# Patient Record
Sex: Female | Born: 1940 | Race: White | Hispanic: No | Marital: Married | Smoking: Never smoker
Health system: Southern US, Community
[De-identification: ages and names within clinical notes are randomized; demographics above are authoritative.]

## PROBLEM LIST (undated history)

## (undated) DIAGNOSIS — R51 Headache: Secondary | ICD-10-CM

## (undated) DIAGNOSIS — Z8589 Personal history of malignant neoplasm of other organs and systems: Secondary | ICD-10-CM

## (undated) DIAGNOSIS — M1990 Unspecified osteoarthritis, unspecified site: Secondary | ICD-10-CM

## (undated) DIAGNOSIS — K219 Gastro-esophageal reflux disease without esophagitis: Secondary | ICD-10-CM

## (undated) DIAGNOSIS — L570 Actinic keratosis: Secondary | ICD-10-CM

## (undated) DIAGNOSIS — C4492 Squamous cell carcinoma of skin, unspecified: Secondary | ICD-10-CM

## (undated) DIAGNOSIS — R519 Headache, unspecified: Secondary | ICD-10-CM

## (undated) HISTORY — PX: JOINT REPLACEMENT: SHX530

## (undated) HISTORY — PX: APPENDECTOMY: SHX54

## (undated) HISTORY — DX: Actinic keratosis: L57.0

## (undated) HISTORY — PX: TONSILLECTOMY: SUR1361

## (undated) HISTORY — PX: ABDOMINAL HYSTERECTOMY: SHX81

## (undated) HISTORY — PX: TRIGGER FINGER RELEASE: SHX641

## (undated) HISTORY — DX: Personal history of malignant neoplasm of other organs and systems: Z85.89

## (undated) HISTORY — PX: CHOLECYSTECTOMY: SHX55

## (undated) HISTORY — PX: COLONOSCOPY: SHX174

## (undated) HISTORY — PX: AUGMENTATION MAMMAPLASTY: SUR837

## (undated) HISTORY — PX: CATARACT EXTRACTION, BILATERAL: SHX1313

## (undated) HISTORY — PX: EYE SURGERY: SHX253

---

## 2004-01-11 ENCOUNTER — Ambulatory Visit: Payer: Self-pay | Admitting: Gastroenterology

## 2004-01-21 ENCOUNTER — Emergency Department: Payer: Self-pay | Admitting: Emergency Medicine

## 2004-04-09 ENCOUNTER — Ambulatory Visit: Payer: Self-pay | Admitting: Internal Medicine

## 2004-11-22 ENCOUNTER — Ambulatory Visit: Payer: Self-pay

## 2005-04-10 ENCOUNTER — Ambulatory Visit: Payer: Self-pay | Admitting: Internal Medicine

## 2006-04-13 ENCOUNTER — Ambulatory Visit: Payer: Self-pay | Admitting: Internal Medicine

## 2007-04-15 ENCOUNTER — Ambulatory Visit: Payer: Self-pay | Admitting: Internal Medicine

## 2008-04-17 ENCOUNTER — Ambulatory Visit: Payer: Self-pay | Admitting: Internal Medicine

## 2008-09-11 ENCOUNTER — Ambulatory Visit: Payer: Self-pay

## 2009-02-06 ENCOUNTER — Ambulatory Visit: Payer: Self-pay | Admitting: Gastroenterology

## 2009-04-18 ENCOUNTER — Ambulatory Visit: Payer: Self-pay | Admitting: Internal Medicine

## 2010-04-22 ENCOUNTER — Ambulatory Visit: Payer: Self-pay | Admitting: Internal Medicine

## 2011-03-21 ENCOUNTER — Ambulatory Visit: Payer: Self-pay | Admitting: Internal Medicine

## 2011-04-23 ENCOUNTER — Ambulatory Visit: Payer: Self-pay | Admitting: Internal Medicine

## 2012-02-26 ENCOUNTER — Encounter: Payer: Self-pay | Admitting: Rheumatology

## 2012-02-28 ENCOUNTER — Encounter: Payer: Self-pay | Admitting: Rheumatology

## 2012-04-23 ENCOUNTER — Ambulatory Visit: Payer: Self-pay | Admitting: Internal Medicine

## 2012-11-22 DIAGNOSIS — C4492 Squamous cell carcinoma of skin, unspecified: Secondary | ICD-10-CM

## 2012-11-22 HISTORY — DX: Squamous cell carcinoma of skin, unspecified: C44.92

## 2013-04-25 ENCOUNTER — Ambulatory Visit: Payer: Self-pay | Admitting: Family Medicine

## 2013-05-03 ENCOUNTER — Ambulatory Visit: Payer: Self-pay | Admitting: Family Medicine

## 2013-07-12 DIAGNOSIS — G43909 Migraine, unspecified, not intractable, without status migrainosus: Secondary | ICD-10-CM | POA: Insufficient documentation

## 2013-07-12 DIAGNOSIS — G479 Sleep disorder, unspecified: Secondary | ICD-10-CM | POA: Insufficient documentation

## 2014-02-23 ENCOUNTER — Ambulatory Visit: Payer: Self-pay | Admitting: Gastroenterology

## 2014-04-28 ENCOUNTER — Ambulatory Visit
Admit: 2014-04-28 | Disposition: A | Discharge status: Home / Self Care | Payer: Self-pay | Attending: Family Medicine | Admitting: Family Medicine

## 2015-02-02 ENCOUNTER — Other Ambulatory Visit: Payer: Self-pay | Admitting: Orthopedic Surgery

## 2015-02-02 DIAGNOSIS — M25562 Pain in left knee: Secondary | ICD-10-CM

## 2015-02-21 ENCOUNTER — Ambulatory Visit
Admission: RE | Admit: 2015-02-21 | Discharge: 2015-02-21 | Disposition: A | Discharge status: Home / Self Care | Payer: Medicare Other | Source: Ambulatory Visit | Attending: Orthopedic Surgery | Admitting: Orthopedic Surgery

## 2015-02-21 DIAGNOSIS — M659 Synovitis and tenosynovitis, unspecified: Secondary | ICD-10-CM | POA: Diagnosis not present

## 2015-02-21 DIAGNOSIS — M25461 Effusion, right knee: Secondary | ICD-10-CM | POA: Diagnosis not present

## 2015-02-21 DIAGNOSIS — M1712 Unilateral primary osteoarthritis, left knee: Secondary | ICD-10-CM | POA: Diagnosis not present

## 2015-02-21 DIAGNOSIS — M25562 Pain in left knee: Secondary | ICD-10-CM

## 2015-02-21 DIAGNOSIS — X58XXXA Exposure to other specified factors, initial encounter: Secondary | ICD-10-CM | POA: Insufficient documentation

## 2015-02-21 DIAGNOSIS — S83232A Complex tear of medial meniscus, current injury, left knee, initial encounter: Secondary | ICD-10-CM | POA: Diagnosis not present

## 2015-03-23 ENCOUNTER — Encounter
Admission: RE | Admit: 2015-03-23 | Discharge: 2015-03-23 | Disposition: A | Discharge status: Home / Self Care | Payer: Medicare Other | Source: Ambulatory Visit | Attending: Orthopedic Surgery | Admitting: Orthopedic Surgery

## 2015-03-23 DIAGNOSIS — Z01812 Encounter for preprocedural laboratory examination: Secondary | ICD-10-CM | POA: Insufficient documentation

## 2015-03-23 DIAGNOSIS — Z01810 Encounter for preprocedural cardiovascular examination: Secondary | ICD-10-CM | POA: Diagnosis present

## 2015-03-23 HISTORY — DX: Gastro-esophageal reflux disease without esophagitis: K21.9

## 2015-03-23 HISTORY — DX: Unspecified osteoarthritis, unspecified site: M19.90

## 2015-03-23 HISTORY — DX: Headache, unspecified: R51.9

## 2015-03-23 HISTORY — DX: Headache: R51

## 2015-03-23 LAB — CBC
HCT: 39 % (ref 35.0–47.0)
Hemoglobin: 13.3 g/dL (ref 12.0–16.0)
MCH: 31.8 pg (ref 26.0–34.0)
MCHC: 34.2 g/dL (ref 32.0–36.0)
MCV: 92.9 fL (ref 80.0–100.0)
PLATELETS: 239 10*3/uL (ref 150–440)
RBC: 4.2 MIL/uL (ref 3.80–5.20)
RDW: 12.8 % (ref 11.5–14.5)
WBC: 6.9 10*3/uL (ref 3.6–11.0)

## 2015-03-23 NOTE — Patient Instructions (Signed)
  Your procedure is scheduled on: Monday 04/02/15 Report to Day Surgery. 2ND FLOOR MEDICAL MALL ENTRANCE To find out your arrival time please call [phone] between 1PM - 3PM on Friday 03/30/15.  Remember: Instructions that are not followed completely may result in serious medical risk, up to and including death, or upon the discretion of your surgeon and anesthesiologist your surgery may need to be rescheduled.    __X__ 1. Do not eat food or drink liquids after midnight. No gum chewing or hard candies.     __X__ 2. No Alcohol for 24 hours before or after surgery.   ____ 3. Bring all medications with you on the day of surgery if instructed.    __X__ 4. Notify your doctor if there is any change in your medical condition     (cold, fever, infections).     Do not wear jewelry, make-up, hairpins, clips or nail polish.  Do not wear lotions, powders, or perfumes.   Do not shave 48 hours prior to surgery. Men may shave face and neck.  Do not bring valuables to the hospital.    [facility] is not responsible for any belongings or valuables.               Contacts, dentures or bridgework may not be worn into surgery.  Leave your suitcase in the car. After surgery it may be brought to your room.  For patients admitted to the hospital, discharge time is determined by your                treatment team.   Patients discharged the day of surgery will not be allowed to drive home.   Please read over the following fact sheets that you were given:   Surgical Site Infection Prevention   __X__ Take these medicines the morning of surgery with A SIP OF WATER:    1. OMEPRAZOLE AT BEDTIME NIGHT BEFORE SURGERY AND MORNING OF  2.   3.   4.  5.  6.  ____ Fleet Enema (as directed)   __X__ Use CHG Soap as directed  ____ Use inhalers on the day of surgery  ____ Stop metformin 2 days prior to surgery    ____ Take 1/2 of usual insulin dose the night before surgery and none on the morning of surgery.    ____ Stop Coumadin/Plavix/aspirin on   __X__ Stop Anti-inflammatories on TODAY, NAPROXEN   __X__ Stop supplements until after surgery.  (B50, GLUCOSAMINE, GRAPE SEED, FISH OIL)  ____ Bring C-Pap to the hospital.

## 2015-03-23 NOTE — Pre-Procedure Instructions (Signed)
Discussed need for crutch instruction with patient she stated "I have crutches from my previous knee replacement and i remember how to use them and  do not need any new instruction."

## 2015-03-24 NOTE — Pre-Procedure Instructions (Signed)
EKG sent to anesthesia for review

## 2015-03-26 NOTE — Pre-Procedure Instructions (Signed)
CALLED FOR EKG FROM 2013 [facility]

## 2015-03-30 ENCOUNTER — Other Ambulatory Visit: Payer: Self-pay | Admitting: Family Medicine

## 2015-03-30 DIAGNOSIS — Z1231 Encounter for screening mammogram for malignant neoplasm of breast: Secondary | ICD-10-CM

## 2015-04-02 ENCOUNTER — Ambulatory Visit: Payer: Medicare Other | Admitting: Anesthesiology

## 2015-04-02 ENCOUNTER — Ambulatory Visit
Admission: RE | Admit: 2015-04-02 | Discharge: 2015-04-02 | Disposition: A | Discharge status: Home / Self Care | Payer: Medicare Other | Source: Ambulatory Visit | Attending: Orthopedic Surgery | Admitting: Orthopedic Surgery

## 2015-04-02 ENCOUNTER — Encounter
Admission: RE | Disposition: A | Discharge status: Home / Self Care | Payer: Self-pay | Source: Ambulatory Visit | Attending: Orthopedic Surgery

## 2015-04-02 ENCOUNTER — Encounter: Payer: Self-pay | Admitting: *Deleted

## 2015-04-02 DIAGNOSIS — Z808 Family history of malignant neoplasm of other organs or systems: Secondary | ICD-10-CM | POA: Insufficient documentation

## 2015-04-02 DIAGNOSIS — M25462 Effusion, left knee: Secondary | ICD-10-CM | POA: Diagnosis not present

## 2015-04-02 DIAGNOSIS — Z8249 Family history of ischemic heart disease and other diseases of the circulatory system: Secondary | ICD-10-CM | POA: Insufficient documentation

## 2015-04-02 DIAGNOSIS — Z885 Allergy status to narcotic agent status: Secondary | ICD-10-CM | POA: Insufficient documentation

## 2015-04-02 DIAGNOSIS — M23312 Other meniscus derangements, anterior horn of medial meniscus, left knee: Secondary | ICD-10-CM | POA: Diagnosis not present

## 2015-04-02 DIAGNOSIS — M2242 Chondromalacia patellae, left knee: Secondary | ICD-10-CM | POA: Insufficient documentation

## 2015-04-02 DIAGNOSIS — Z90710 Acquired absence of both cervix and uterus: Secondary | ICD-10-CM | POA: Diagnosis not present

## 2015-04-02 DIAGNOSIS — Z888 Allergy status to other drugs, medicaments and biological substances status: Secondary | ICD-10-CM | POA: Diagnosis not present

## 2015-04-02 DIAGNOSIS — Z9841 Cataract extraction status, right eye: Secondary | ICD-10-CM | POA: Diagnosis not present

## 2015-04-02 DIAGNOSIS — Z882 Allergy status to sulfonamides status: Secondary | ICD-10-CM | POA: Insufficient documentation

## 2015-04-02 DIAGNOSIS — Z8371 Family history of colonic polyps: Secondary | ICD-10-CM | POA: Diagnosis not present

## 2015-04-02 DIAGNOSIS — Z8261 Family history of arthritis: Secondary | ICD-10-CM | POA: Diagnosis not present

## 2015-04-02 DIAGNOSIS — M1990 Unspecified osteoarthritis, unspecified site: Secondary | ICD-10-CM | POA: Insufficient documentation

## 2015-04-02 DIAGNOSIS — L309 Dermatitis, unspecified: Secondary | ICD-10-CM | POA: Insufficient documentation

## 2015-04-02 DIAGNOSIS — Z9889 Other specified postprocedural states: Secondary | ICD-10-CM | POA: Insufficient documentation

## 2015-04-02 DIAGNOSIS — M23322 Other meniscus derangements, posterior horn of medial meniscus, left knee: Secondary | ICD-10-CM | POA: Insufficient documentation

## 2015-04-02 DIAGNOSIS — Z807 Family history of other malignant neoplasms of lymphoid, hematopoietic and related tissues: Secondary | ICD-10-CM | POA: Diagnosis not present

## 2015-04-02 DIAGNOSIS — Z96651 Presence of right artificial knee joint: Secondary | ICD-10-CM | POA: Insufficient documentation

## 2015-04-02 DIAGNOSIS — Z9842 Cataract extraction status, left eye: Secondary | ICD-10-CM | POA: Diagnosis not present

## 2015-04-02 DIAGNOSIS — Z833 Family history of diabetes mellitus: Secondary | ICD-10-CM | POA: Diagnosis not present

## 2015-04-02 DIAGNOSIS — Z79899 Other long term (current) drug therapy: Secondary | ICD-10-CM | POA: Insufficient documentation

## 2015-04-02 DIAGNOSIS — M2392 Unspecified internal derangement of left knee: Secondary | ICD-10-CM | POA: Diagnosis present

## 2015-04-02 HISTORY — PX: KNEE ARTHROSCOPY: SHX127

## 2015-04-02 SURGERY — ARTHROSCOPY, KNEE
Anesthesia: General | Site: Knee | Laterality: Left | Wound class: Clean

## 2015-04-02 MED ORDER — HYDROCODONE-ACETAMINOPHEN 5-325 MG PO TABS: 1.0000 | ORAL_TABLET | ORAL | Status: DC | PRN

## 2015-04-02 MED ORDER — MIDAZOLAM HCL 2 MG/2ML IJ SOLN
INTRAMUSCULAR | Status: DC | PRN
  Administered 2015-04-02: 2 mg via INTRAVENOUS

## 2015-04-02 MED ORDER — ONDANSETRON HCL 4 MG/2ML IJ SOLN
INTRAMUSCULAR | Status: DC | PRN
  Administered 2015-04-02: 4 mg via INTRAVENOUS

## 2015-04-02 MED ORDER — ACETAMINOPHEN 10 MG/ML IV SOLN
INTRAVENOUS | Status: DC | PRN
  Administered 2015-04-02: 1000 mg via INTRAVENOUS

## 2015-04-02 MED ORDER — PHENYLEPHRINE HCL 10 MG/ML IJ SOLN
INTRAMUSCULAR | Status: DC | PRN
  Administered 2015-04-02 (×2): 100 ug via INTRAVENOUS

## 2015-04-02 MED ORDER — GLYCOPYRROLATE 0.2 MG/ML IJ SOLN
INTRAMUSCULAR | Status: DC | PRN
  Administered 2015-04-02: 0.2 mg via INTRAVENOUS

## 2015-04-02 MED ORDER — MORPHINE SULFATE (PF) 4 MG/ML IV SOLN
INTRAVENOUS | Status: AC
  Filled 2015-04-02: qty 1

## 2015-04-02 MED ORDER — BUPIVACAINE-EPINEPHRINE (PF) 0.25% -1:200000 IJ SOLN
INTRAMUSCULAR | Status: AC
  Filled 2015-04-02: qty 30

## 2015-04-02 MED ORDER — CEFAZOLIN SODIUM-DEXTROSE 2-3 GM-% IV SOLR
INTRAVENOUS | Status: AC
  Filled 2015-04-02: qty 50

## 2015-04-02 MED ORDER — FENTANYL CITRATE (PF) 100 MCG/2ML IJ SOLN
25.0000 ug | INTRAMUSCULAR | Status: DC | PRN
  Administered 2015-04-02 (×4): 25 ug via INTRAVENOUS

## 2015-04-02 MED ORDER — CEFAZOLIN SODIUM-DEXTROSE 2-3 GM-% IV SOLR
2.0000 g | Freq: Once | INTRAVENOUS | Status: AC
  Administered 2015-04-02: 2 g via INTRAVENOUS

## 2015-04-02 MED ORDER — FENTANYL CITRATE (PF) 100 MCG/2ML IJ SOLN
INTRAMUSCULAR | Status: DC | PRN
  Administered 2015-04-02: 100 ug via INTRAVENOUS
  Administered 2015-04-02: 50 ug via INTRAVENOUS

## 2015-04-02 MED ORDER — MORPHINE SULFATE (PF) 4 MG/ML IV SOLN
INTRAVENOUS | Status: DC | PRN
  Administered 2015-04-02: 4 mg

## 2015-04-02 MED ORDER — ACETAMINOPHEN 10 MG/ML IV SOLN
INTRAVENOUS | Status: AC
  Filled 2015-04-02: qty 100

## 2015-04-02 MED ORDER — LIDOCAINE HCL (CARDIAC) 20 MG/ML IV SOLN
INTRAVENOUS | Status: DC | PRN
  Administered 2015-04-02: 100 mg via INTRAVENOUS

## 2015-04-02 MED ORDER — BUPIVACAINE HCL 0.25 % IJ SOLN
INTRAMUSCULAR | Status: DC | PRN
  Administered 2015-04-02: 25 mL
  Administered 2015-04-02: 5 mL

## 2015-04-02 MED ORDER — ONDANSETRON HCL 4 MG/2ML IJ SOLN: 4.0000 mg | Freq: Once | INTRAMUSCULAR | Status: DC | PRN

## 2015-04-02 MED ORDER — FENTANYL CITRATE (PF) 100 MCG/2ML IJ SOLN
INTRAMUSCULAR | Status: AC
  Administered 2015-04-02: 25 ug via INTRAVENOUS
  Filled 2015-04-02: qty 2

## 2015-04-02 MED ORDER — FAMOTIDINE 20 MG PO TABS
ORAL_TABLET | ORAL | Status: AC
  Filled 2015-04-02: qty 1

## 2015-04-02 MED ORDER — EPHEDRINE SULFATE 50 MG/ML IJ SOLN
INTRAMUSCULAR | Status: DC | PRN
  Administered 2015-04-02: 5 mg via INTRAVENOUS
  Administered 2015-04-02: 10 mg via INTRAVENOUS

## 2015-04-02 MED ORDER — PROPOFOL 10 MG/ML IV BOLUS
INTRAVENOUS | Status: DC | PRN
  Administered 2015-04-02: 140 mg via INTRAVENOUS

## 2015-04-02 MED ORDER — DEXAMETHASONE SODIUM PHOSPHATE 10 MG/ML IJ SOLN
INTRAMUSCULAR | Status: DC | PRN
  Administered 2015-04-02: 10 mg via INTRAVENOUS

## 2015-04-02 MED ORDER — LACTATED RINGERS IV SOLN
INTRAVENOUS | Status: DC
  Administered 2015-04-02 (×2): via INTRAVENOUS

## 2015-04-02 SURGICAL SUPPLY — 21 items
BLADE SHAVER 4.5 DBL SERAT CV (CUTTER) ×3 IMPLANT
BNDG ESMARK 6X12 TAN STRL LF (GAUZE/BANDAGES/DRESSINGS) ×3 IMPLANT
DRSG DERMACEA 8X12 NADH (GAUZE/BANDAGES/DRESSINGS) ×3 IMPLANT
DURAPREP 26ML APPLICATOR (WOUND CARE) ×6 IMPLANT
GAUZE SPONGE 4X4 12PLY STRL (GAUZE/BANDAGES/DRESSINGS) ×3 IMPLANT
GLOVE BIOGEL M STRL SZ7.5 (GLOVE) ×3 IMPLANT
GLOVE INDICATOR 8.0 STRL GRN (GLOVE) ×3 IMPLANT
GOWN STRL REUS W/ TWL LRG LVL3 (GOWN DISPOSABLE) ×2 IMPLANT
GOWN STRL REUS W/TWL LRG LVL3 (GOWN DISPOSABLE) ×4
IV LACTATED RINGER IRRG 3000ML (IV SOLUTION) ×12
IV LR IRRIG 3000ML ARTHROMATIC (IV SOLUTION) ×6 IMPLANT
MANIFOLD NEPTUNE II (INSTRUMENTS) ×3 IMPLANT
PACK ARTHROSCOPY KNEE (MISCELLANEOUS) ×3 IMPLANT
SET TUBE SUCT SHAVER OUTFL 24K (TUBING) ×3 IMPLANT
SET TUBE TIP INTRA-ARTICULAR (MISCELLANEOUS) ×3 IMPLANT
STRAP SAFETY BODY (MISCELLANEOUS) ×3 IMPLANT
SUT ETHILON 3-0 FS-10 30 BLK (SUTURE) ×3
SUTURE EHLN 3-0 FS-10 30 BLK (SUTURE) ×1 IMPLANT
TUBING ARTHRO INFLOW-ONLY STRL (TUBING) ×3 IMPLANT
WAND HAND CNTRL MULTIVAC 50 (MISCELLANEOUS) ×3 IMPLANT
WRAP KNEE W/COLD PACKS 25.5X14 (SOFTGOODS) ×3 IMPLANT

## 2015-04-02 NOTE — Anesthesia Procedure Notes (Signed)
Procedure Name: LMA Insertion Date/Time: 04/02/2015 4:03 PM Performed by: Silvana Newness Pre-anesthesia Checklist: Patient identified, Emergency Drugs available, Suction available, Patient being monitored and Timeout performed Patient Re-evaluated:Patient Re-evaluated prior to inductionOxygen Delivery Method: Circle system utilized Preoxygenation: Pre-oxygenation with 100% oxygen Intubation Type: IV induction Ventilation: Mask ventilation without difficulty LMA: LMA inserted LMA Size: 3.5 Number of attempts: 1 Placement Confirmation: positive ETCO2 and breath sounds checked- equal and bilateral Tube secured with: Tape Dental Injury: Teeth and Oropharynx as per pre-operative assessment  Comments: Inserted by Nelda Marseille CRNA

## 2015-04-02 NOTE — Op Note (Signed)
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   ASSISTANT: none  ANESTHESIA: general  ESTIMATED BLOOD LOSS: Minimal  FLUIDS REPLACED: 700 mL of crystalloid  TOURNIQUET TIME:  not used   DRAINS: none  IMPLANTS UTILIZED:  None  INDICATIONS FOR SURGERY: [name] is a 75 y.o. year old female who has been seen for complaints of left knee pain. MRI demonstrated findings consistent with meniscal pathology. After discussion of the risks and benefits of surgical intervention, the patient expressed understanding of the risks benefits and agree with plans for left knee arthroscopy.   PROCEDURE IN DETAIL: The patient was brought into the operating room and, after adequate general anesthesia was achieved, a tourniquet was applied to the left thigh and the leg was placed in the leg holder. All bony prominences were well padded. The patient's left knee was cleaned and prepped with alcohol and Duraprep and draped in the usual sterile fashion. A "timeout" was performed as per usual protocol. The anticipated portal sites were injected with 0.25% Marcaine with epinephrine. An anterolateral incision was made and a cannula was inserted. A small effusion was evacuated and the knee was distended with fluid using the pump. The scope was advanced down the medial gutter into the medial compartment. Under visualization with the scope, an anteromedial portal was created and a hooked probe was inserted. The medial meniscus was visualized and probed. Degenerative tears were noted to both the  anterior and posterior horn of the medial meniscus. These areas were debrided using meniscal punches and a 4.5 mm shaver. Final contouring was performed using a 50 ArthroCare wand. The articular cartilage was visualized. Extensive areas of grade 4 chondromalacia were noted to both the medial femoral condyle and medial tibial plateau. These areas were debrided and contoured using the 50 ArthroCare wand. The scope was then advanced into the intercondylar notch. The anterior cruciate ligament was visualized and probed and felt to be intact. The scope was removed from the lateral portal and reinserted via the anteromedial portal to better visualize the lateral compartment. The lateral meniscus was visualized and probed. The lateral meniscus was intact without evidence of tear or instability.  The articular cartilage of the lateral compartment was visualized and noted to be in good condition.  Finally, the scope was advanced so as to visualize the patellofemoral articulation. Good patellar tracking was appreciated. Mild chondral changes were noted.  The knee was irrigated with copius amounts of fluid and suctioned dry. The anterolateral portal was re-approximated with #3-0 nylon. A combination of 0.25% Marcaine with epinephrine and 4 mg of Morphine were injected via the scope. The scope was removed and the anteromedial portal was re-approximated with #3-0 nylon. A sterile dressing was applied followed by application of an ice wrap.  The patient tolerated the procedure well and was transported to the PACU in stable condition.  James P. Holley Bouche., M.D.

## 2015-04-02 NOTE — H&P (Signed)
The patient has been re-examined, and the chart reviewed, and there have been no interval changes to the documented history and physical.    The risks, benefits, and alternatives have been discussed at length. The patient expressed understanding of the risks benefits and agreed with plans for surgical intervention.  James P. Hooten, Jr. M.D.    

## 2015-04-02 NOTE — Discharge Instructions (Signed)
°  Instructions after Knee Arthroscopy  ° °- James P. Hooten, Jr., M.D.    ° Dept. of Orthopaedics & Sports Medicine ° [facility] ° [address] ° [place], [place]  [address] ° ° Phone: [phone]   Fax: [phone] ° ° °DIET: °• Drink plenty of non-alcoholic fluids & begin a light diet. °• Resume your normal diet the day after surgery. ° °ACTIVITY:  °• You may use crutches or a walker with weight-bearing as tolerated, unless instructed otherwise. °• You may wean yourself off of the walker or crutches as tolerated.  °• Begin doing gentle exercises. Exercising will reduce the pain and swelling, increase motion, and prevent muscle weakness.   °• Avoid strenuous activities or athletics for a minimum of 4-6 weeks after arthroscopic surgery. °• Do not drive or operate any equipment until instructed. ° °WOUND CARE:  °• Place one to two pillows under the knee the first day or two when sitting or lying.  °• Continue to use the ice packs periodically to reduce pain and swelling. °• The small incisions in your knee are closed with nylon stitches. The stitches will be removed in the office. °• The bulky dressing may be removed on the second day after surgery. DO NOT TOUCH THE STITCHES. Put a Band-Aid over each stitch. Do NOT use any ointments or creams on the incisions.  °• You may bathe or shower after the stitches are removed at the first office visit following surgery. ° °MEDICATIONS: °• You may resume your regular medications. °• Please take the pain medication as prescribed. °• Do not take pain medication on an empty stomach. °• Do not drive or drink alcoholic beverages when taking pain medications. ° °CALL THE OFFICE FOR: °• Temperature above 101 degrees °• Excessive bleeding or drainage on the dressing. °• Excessive swelling, coldness, or paleness of the toes. °• Persistent nausea and vomiting. ° °FOLLOW-UP:  °• You should have an appointment to return to the office in 7-10 days after surgery.   ° ° °AMBULATORY SURGERY  °DISCHARGE INSTRUCTIONS ° ° °1) The drugs that you were given will stay in your system until tomorrow so for the next 24 hours you should not: ° °A) Drive an automobile °B) Make any legal decisions °C) Drink any alcoholic beverage ° ° °2) You may resume regular meals tomorrow.  Today it is better to start with liquids and gradually work up to solid foods. ° °You may eat anything you prefer, but it is better to start with liquids, then soup and crackers, and gradually work up to solid foods. ° ° °3) Please notify your doctor immediately if you have any unusual bleeding, trouble breathing, redness and pain at the surgery site, drainage, fever, or pain not relieved by medication. ° ° ° °4) Additional Instructions: ° ° ° ° ° ° ° °Please contact your physician with any problems or Same Day Surgery at [phone], Monday through Friday 6 am to 4 pm, or [place] [facility] [place] Main number at [phone]. ° °

## 2015-04-02 NOTE — Transfer of Care (Signed)
Immediate Anesthesia Transfer of Care Note  Patient: Donna Jefferson  Procedure(s) Performed: Procedure(s): ARTHROSCOPY KNEE. PARTIAL MEDIAL MENISECTOMY, MEDIAL CHONDROPLASTY (Left)  Patient Location: PACU  Anesthesia Type:General  Level of Consciousness: awake, alert , oriented and patient cooperative  Airway & Oxygen Therapy: Patient Spontanous Breathing and Patient connected to face mask oxygen  Post-op Assessment: Report given to RN, Post -op Vital signs reviewed and stable and Patient moving all extremities X 4  Post vital signs: Reviewed and stable  Last Vitals: ECG: 95, [id number], sats 0000000  Complications: No apparent anesthesia complications

## 2015-04-02 NOTE — Anesthesia Preprocedure Evaluation (Signed)
Anesthesia Evaluation  Patient identified by MRN, date of birth, ID band Patient awake    Reviewed: Allergy & Precautions, H&P , NPO status , Patient's Chart, lab work & pertinent test results, reviewed documented beta blocker date and time   History of Anesthesia Complications Negative for: history of anesthetic complications  Airway Mallampati: III  TM Distance: >3 FB Neck ROM: full    Dental no notable dental hx. (+) Caps, Teeth Intact   Pulmonary neg pulmonary ROS, former smoker,    Pulmonary exam normal breath sounds clear to auscultation       Cardiovascular Exercise Tolerance: Good negative cardio ROS Normal cardiovascular exam Rhythm:regular Rate:Normal     Neuro/Psych  Headaches, neg Seizures negative psych ROS   GI/Hepatic Neg liver ROS, GERD  Medicated,  Endo/Other  negative endocrine ROS  Renal/GU negative Renal ROS  negative genitourinary   Musculoskeletal   Abdominal   Peds  Hematology negative hematology ROS (+)   Anesthesia Other Findings Past Medical History:   Headache                                                     Arthritis                                                      Comment:osteoarthritis   GERD (gastroesophageal reflux disease)                       Cancer (HCC)                                                   Comment:squamous cell skin cancer   Reproductive/Obstetrics negative OB ROS                             Anesthesia Physical Anesthesia Plan  ASA: II  Anesthesia Plan: General   Post-op Pain Management:    Induction:   Airway Management Planned:   Additional Equipment:   Intra-op Plan:   Post-operative Plan:   Informed Consent: I have reviewed the patients History and Physical, chart, labs and discussed the procedure including the risks, benefits and alternatives for the proposed anesthesia with the patient or authorized  representative who has indicated his/her understanding and acceptance.   Dental Advisory Given  Plan Discussed with: Anesthesiologist, CRNA and Surgeon  Anesthesia Plan Comments:         Anesthesia Quick Evaluation

## 2015-04-02 NOTE — Brief Op Note (Signed)
04/02/2015  5:27 PM  PATIENT:  Randall Hiss  75 y.o. female  PRE-OPERATIVE DIAGNOSIS:  internal derangement of the left knee  POST-OPERATIVE DIAGNOSIS:  MEDIAL MENISCUS TEAR, GRADE IV CHONDROMALACIA MEDIAL COMPARTMENT - Left knee  PROCEDURE:  Procedure(s): ARTHROSCOPY KNEE. PARTIAL MEDIAL MENISECTOMY, MEDIAL CHONDROPLASTY (Left)  SURGEON:  Surgeon(s) and Role:    * Dereck Leep, MD - Primary  ASSISTANTS: none   ANESTHESIA:   general  EBL:  Total I/O In: 700 [I.V.:700] Out: - minimal  BLOOD ADMINISTERED:none  DRAINS: none   LOCAL MEDICATIONS USED:  MARCAINE     SPECIMEN:  No Specimen  DISPOSITION OF SPECIMEN:  N/A  COUNTS:  YES  TOURNIQUET:   not used   DICTATION: .Sales executive  PLAN OF CARE: Discharge to home after PACU  PATIENT DISPOSITION:  PACU - hemodynamically stable.   Delay start of Pharmacological VTE agent (>24hrs) due to surgical blood loss or risk of bleeding: not applicable

## 2015-04-03 ENCOUNTER — Encounter: Payer: Self-pay | Admitting: Orthopedic Surgery

## 2015-04-03 NOTE — Anesthesia Postprocedure Evaluation (Signed)
Anesthesia Post Note  Patient: Donna Jefferson  Procedure(s) Performed: Procedure(s) (LRB): ARTHROSCOPY KNEE. PARTIAL MEDIAL MENISECTOMY, MEDIAL CHONDROPLASTY (Left)  Patient location during evaluation: PACU Anesthesia Type: General Level of consciousness: awake and alert and oriented Pain management: pain level controlled Vital Signs Assessment: post-procedure vital signs reviewed and stable Respiratory status: spontaneous breathing Cardiovascular status: blood pressure returned to baseline Anesthetic complications: no    Last Vitals:  Filed Vitals:   04/02/15 1828 04/02/15 1858  BP: 134/54 147/58  Pulse:  76  Temp: 36.3 C 36.2 C  Resp: 14 14    Last Pain:  Filed Vitals:   04/03/15 0822  PainSc: 0-No pain                 Indria Bishara

## 2015-04-30 ENCOUNTER — Ambulatory Visit: Payer: Medicare Other

## 2015-05-02 ENCOUNTER — Other Ambulatory Visit: Payer: Self-pay | Admitting: Family Medicine

## 2015-05-02 ENCOUNTER — Ambulatory Visit
Admission: RE | Admit: 2015-05-02 | Discharge: 2015-05-02 | Disposition: A | Discharge status: Home / Self Care | Payer: Medicare Other | Source: Ambulatory Visit | Attending: Family Medicine | Admitting: Family Medicine

## 2015-05-02 DIAGNOSIS — Z9882 Breast implant status: Secondary | ICD-10-CM | POA: Insufficient documentation

## 2015-05-02 DIAGNOSIS — Z1231 Encounter for screening mammogram for malignant neoplasm of breast: Secondary | ICD-10-CM | POA: Diagnosis present

## 2016-02-24 DIAGNOSIS — M1712 Unilateral primary osteoarthritis, left knee: Secondary | ICD-10-CM | POA: Insufficient documentation

## 2016-03-26 ENCOUNTER — Encounter
Admission: RE | Admit: 2016-03-26 | Discharge: 2016-03-26 | Disposition: A | Discharge status: Home / Self Care | Payer: Medicare Other | Source: Ambulatory Visit | Attending: Orthopedic Surgery | Admitting: Orthopedic Surgery

## 2016-03-26 DIAGNOSIS — Z01818 Encounter for other preprocedural examination: Secondary | ICD-10-CM | POA: Diagnosis present

## 2016-03-26 DIAGNOSIS — Z01810 Encounter for preprocedural cardiovascular examination: Secondary | ICD-10-CM | POA: Diagnosis not present

## 2016-03-26 DIAGNOSIS — R011 Cardiac murmur, unspecified: Secondary | ICD-10-CM | POA: Insufficient documentation

## 2016-03-26 DIAGNOSIS — Z01812 Encounter for preprocedural laboratory examination: Secondary | ICD-10-CM | POA: Insufficient documentation

## 2016-03-26 LAB — CBC
HCT: 39.1 % (ref 35.0–47.0)
Hemoglobin: 13.3 g/dL (ref 12.0–16.0)
MCH: 31.2 pg (ref 26.0–34.0)
MCHC: 34.1 g/dL (ref 32.0–36.0)
MCV: 91.6 fL (ref 80.0–100.0)
Platelets: 260 10*3/uL (ref 150–440)
RBC: 4.27 MIL/uL (ref 3.80–5.20)
RDW: 13.4 % (ref 11.5–14.5)
WBC: 7.6 10*3/uL (ref 3.6–11.0)

## 2016-03-26 LAB — COMPREHENSIVE METABOLIC PANEL
ALT: 20 U/L (ref 14–54)
ANION GAP: 8 (ref 5–15)
AST: 29 U/L (ref 15–41)
Albumin: 4.5 g/dL (ref 3.5–5.0)
Alkaline Phosphatase: 83 U/L (ref 38–126)
BUN: 14 mg/dL (ref 6–20)
CHLORIDE: 100 mmol/L — AB (ref 101–111)
CO2: 28 mmol/L (ref 22–32)
CREATININE: 0.72 mg/dL (ref 0.44–1.00)
Calcium: 9.3 mg/dL (ref 8.9–10.3)
GFR calc Af Amer: >60 mL/min (ref >60)
Glucose, Bld: 105 mg/dL — ABNORMAL HIGH (ref 65–99)
POTASSIUM: 3.6 mmol/L (ref 3.5–5.1)
Sodium: 136 mmol/L (ref 135–145)
Total Bilirubin: 0.4 mg/dL (ref 0.3–1.2)
Total Protein: 7.7 g/dL (ref 6.5–8.1)

## 2016-03-26 LAB — SURGICAL PCR SCREEN
MRSA, PCR: NEGATIVE
STAPHYLOCOCCUS AUREUS: NEGATIVE

## 2016-03-26 LAB — URINALYSIS, ROUTINE W REFLEX MICROSCOPIC
Bilirubin Urine: NEGATIVE
GLUCOSE, UA: NEGATIVE mg/dL
HGB URINE DIPSTICK: NEGATIVE
KETONES UR: NEGATIVE mg/dL
LEUKOCYTES UA: NEGATIVE
Nitrite: NEGATIVE
PH: 6 (ref 5.0–8.0)
Protein, ur: NEGATIVE mg/dL
Specific Gravity, Urine: 1.004 — ABNORMAL LOW (ref 1.005–1.030)

## 2016-03-26 LAB — C-REACTIVE PROTEIN

## 2016-03-26 LAB — TYPE AND SCREEN
ABO/RH(D): O POS
Antibody Screen: NEGATIVE

## 2016-03-26 LAB — PROTIME-INR
INR: 0.92
PROTHROMBIN TIME: 12.4 s (ref 11.4–15.2)

## 2016-03-26 LAB — SEDIMENTATION RATE: SED RATE: 27 mm/h (ref 0–30)

## 2016-03-26 LAB — APTT: aPTT: 31 seconds (ref 24–36)

## 2016-03-26 NOTE — Patient Instructions (Signed)
  Your procedure is scheduled on: April 09, 2016 [facility]) Report to Same Day Surgery 2nd floor medical mall ([place] Entrance-take elevator on left to 2nd floor.  Check in with surgery information desk.) To find out your arrival time please call [phone] between 1PM - 3PM on April 08, 2016 (TUESDAY)  Remember: Instructions that are not followed completely may result in serious medical risk, up to and including death, or upon the discretion of your surgeon and anesthesiologist your surgery may need to be rescheduled.    _x___ 1. Do not eat food or drink liquids after midnight. No gum chewing or hard candies.     __x__ 2. No Alcohol for 24 hours before or after surgery.   __x__3. No Smoking for 24 prior to surgery.   ____  4. Bring all medications with you on the day of surgery if instructed.    __x__ 5. Notify your doctor if there is any change in your medical condition     (cold, fever, infections).     Do not wear jewelry, make-up, hairpins, clips or nail polish.  Do not wear lotions, powders, or perfumes. You may wear deodorant.  Do not shave 48 hours prior to surgery. Men may shave face and neck.  Do not bring valuables to the hospital.    [facility] is not responsible for any belongings or valuables.               Contacts, dentures or bridgework may not be worn into surgery.  Leave your suitcase in the car. After surgery it may be brought to your room.  For patients admitted to the hospital, discharge time is determined by your treatment team.   Patients discharged the day of surgery will not be allowed to drive home.  You will need someone to drive you home and stay with you the night of your procedure.    Please read over the following fact sheets that you were given:   [facility] Preparing for Surgery and or MRSA Information   _x___ Take these medicines the morning of surgery with A SIP OF WATER:    1. PRILOSEC (PRILOSEC AT BEDTIME ON TUESDAY  NIGHT)  2.  3.  4.  5.  6.  ____Fleets enema or Magnesium Citrate as directed.   _x___ Use CHG Soap or sage wipes as directed on instruction sheet   ____ Use inhalers on the day of surgery and bring to hospital day of surgery  ____ Stop metformin 2 days prior to surgery    ____ Take 1/2 of usual insulin dose the night before surgery and none on the morning of           surgery.   _x___ Stop Aspirin, Coumadin, Pllavix ,Eliquis, Effient, or Pradaxa  x__ Stop Anti-inflammatories such as Advil, Aleve, Ibuprofen, Motrin, Naproxen,          Naprosyn, Goodies powders or aspirin products. Ok to take Tylenol.   _x___ Stop supplements until after surgery.  (STOP OSTEO_BI FLEX (GLUCOSAMINE). GRAPE SEED, TART CHERRY, BIOTIN, FISH OIL, PROBIOTIC, ) NOW  ____ Bring C-Pap to the hospital.

## 2016-03-27 LAB — URINE CULTURE: Special Requests: NORMAL

## 2016-03-31 ENCOUNTER — Other Ambulatory Visit: Payer: Self-pay | Admitting: Family Medicine

## 2016-03-31 DIAGNOSIS — Z1231 Encounter for screening mammogram for malignant neoplasm of breast: Secondary | ICD-10-CM

## 2016-04-08 MED ORDER — FAMOTIDINE 20 MG PO TABS: 20.0000 mg | ORAL_TABLET | Freq: Once | ORAL | Status: DC

## 2016-04-08 MED ORDER — CEFAZOLIN SODIUM-DEXTROSE 2-4 GM/100ML-% IV SOLN: 2.0000 g | INTRAVENOUS | Status: DC

## 2016-04-08 MED ORDER — TRANEXAMIC ACID 1000 MG/10ML IV SOLN
1000.0000 mg | INTRAVENOUS | Status: DC
  Filled 2016-04-08: qty 10

## 2016-04-09 ENCOUNTER — Inpatient Hospital Stay: Payer: Medicare Other | Admitting: Anesthesiology

## 2016-04-09 ENCOUNTER — Encounter
Admission: RE | Disposition: A | Discharge status: Home Health | Payer: Self-pay | Source: Ambulatory Visit | Attending: Orthopedic Surgery

## 2016-04-09 ENCOUNTER — Inpatient Hospital Stay
Admission: RE | Admit: 2016-04-09 | Discharge: 2016-04-11 | DRG: 470 | Disposition: A | Discharge status: Home Health | Payer: Medicare Other | Source: Ambulatory Visit | Attending: Orthopedic Surgery | Admitting: Orthopedic Surgery

## 2016-04-09 ENCOUNTER — Encounter: Payer: Self-pay | Admitting: Orthopedic Surgery

## 2016-04-09 ENCOUNTER — Inpatient Hospital Stay: Payer: Medicare Other

## 2016-04-09 DIAGNOSIS — Z9841 Cataract extraction status, right eye: Secondary | ICD-10-CM | POA: Diagnosis not present

## 2016-04-09 DIAGNOSIS — M25562 Pain in left knee: Secondary | ICD-10-CM | POA: Diagnosis present

## 2016-04-09 DIAGNOSIS — Z791 Long term (current) use of non-steroidal anti-inflammatories (NSAID): Secondary | ICD-10-CM

## 2016-04-09 DIAGNOSIS — M109 Gout, unspecified: Secondary | ICD-10-CM | POA: Diagnosis present

## 2016-04-09 DIAGNOSIS — Z807 Family history of other malignant neoplasms of lymphoid, hematopoietic and related tissues: Secondary | ICD-10-CM | POA: Diagnosis not present

## 2016-04-09 DIAGNOSIS — Z888 Allergy status to other drugs, medicaments and biological substances status: Secondary | ICD-10-CM

## 2016-04-09 DIAGNOSIS — Z9842 Cataract extraction status, left eye: Secondary | ICD-10-CM

## 2016-04-09 DIAGNOSIS — Z885 Allergy status to narcotic agent status: Secondary | ICD-10-CM | POA: Diagnosis not present

## 2016-04-09 DIAGNOSIS — K219 Gastro-esophageal reflux disease without esophagitis: Secondary | ICD-10-CM | POA: Diagnosis present

## 2016-04-09 DIAGNOSIS — L309 Dermatitis, unspecified: Secondary | ICD-10-CM | POA: Diagnosis present

## 2016-04-09 DIAGNOSIS — Z882 Allergy status to sulfonamides status: Secondary | ICD-10-CM | POA: Diagnosis not present

## 2016-04-09 DIAGNOSIS — Z96652 Presence of left artificial knee joint: Secondary | ICD-10-CM | POA: Diagnosis present

## 2016-04-09 DIAGNOSIS — G43909 Migraine, unspecified, not intractable, without status migrainosus: Secondary | ICD-10-CM | POA: Diagnosis present

## 2016-04-09 DIAGNOSIS — Z8262 Family history of osteoporosis: Secondary | ICD-10-CM

## 2016-04-09 DIAGNOSIS — M1712 Unilateral primary osteoarthritis, left knee: Principal | ICD-10-CM | POA: Diagnosis present

## 2016-04-09 DIAGNOSIS — Z8249 Family history of ischemic heart disease and other diseases of the circulatory system: Secondary | ICD-10-CM | POA: Diagnosis not present

## 2016-04-09 DIAGNOSIS — Z833 Family history of diabetes mellitus: Secondary | ICD-10-CM | POA: Diagnosis not present

## 2016-04-09 DIAGNOSIS — Z808 Family history of malignant neoplasm of other organs or systems: Secondary | ICD-10-CM

## 2016-04-09 DIAGNOSIS — Z85828 Personal history of other malignant neoplasm of skin: Secondary | ICD-10-CM | POA: Diagnosis not present

## 2016-04-09 DIAGNOSIS — Z96659 Presence of unspecified artificial knee joint: Secondary | ICD-10-CM

## 2016-04-09 DIAGNOSIS — Z8371 Family history of colonic polyps: Secondary | ICD-10-CM | POA: Diagnosis not present

## 2016-04-09 HISTORY — PX: KNEE ARTHROPLASTY: SHX992

## 2016-04-09 LAB — ABO/RH: ABO/RH(D): O POS

## 2016-04-09 SURGERY — ARTHROPLASTY, KNEE, TOTAL, USING IMAGELESS COMPUTER-ASSISTED NAVIGATION
Anesthesia: Spinal | Laterality: Left | Wound class: Clean

## 2016-04-09 MED ORDER — ACETAMINOPHEN 325 MG PO TABS: 650.0000 mg | ORAL_TABLET | Freq: Four times a day (QID) | ORAL | Status: DC | PRN

## 2016-04-09 MED ORDER — PHENOL 1.4 % MT LIQD
1.0000 | OROMUCOSAL | Status: DC | PRN
  Filled 2016-04-09: qty 177

## 2016-04-09 MED ORDER — CEFAZOLIN SODIUM-DEXTROSE 2-4 GM/100ML-% IV SOLN
INTRAVENOUS | Status: AC
  Filled 2016-04-09: qty 100

## 2016-04-09 MED ORDER — TETRACAINE HCL 1 % IJ SOLN
INTRAMUSCULAR | Status: DC | PRN
  Administered 2016-04-09: 10 mg via INTRASPINAL

## 2016-04-09 MED ORDER — BUPIVACAINE LIPOSOME 1.3 % IJ SUSP
INTRAMUSCULAR | Status: AC
  Filled 2016-04-09: qty 20

## 2016-04-09 MED ORDER — CALCIUM CARBONATE-VITAMIN D 500-200 MG-UNIT PO TABS
1.0000 | ORAL_TABLET | Freq: Two times a day (BID) | ORAL | Status: DC
  Administered 2016-04-09 – 2016-04-11 (×3): 1 via ORAL
  Filled 2016-04-09 (×3): qty 1

## 2016-04-09 MED ORDER — TRANEXAMIC ACID 1000 MG/10ML IV SOLN
1000.0000 mg | Freq: Once | INTRAVENOUS | Status: AC
  Administered 2016-04-09: 1000 mg via INTRAVENOUS
  Filled 2016-04-09: qty 10

## 2016-04-09 MED ORDER — ACETAMINOPHEN 650 MG RE SUPP: 650.0000 mg | Freq: Four times a day (QID) | RECTAL | Status: DC | PRN

## 2016-04-09 MED ORDER — SUMATRIPTAN SUCCINATE 50 MG PO TABS: 100.0000 mg | ORAL_TABLET | ORAL | Status: DC | PRN

## 2016-04-09 MED ORDER — LACTATED RINGERS IV SOLN
INTRAVENOUS | Status: DC
  Administered 2016-04-09 (×2): via INTRAVENOUS

## 2016-04-09 MED ORDER — PHENYLEPHRINE HCL 10 MG/ML IJ SOLN
INTRAMUSCULAR | Status: AC
  Filled 2016-04-09: qty 1

## 2016-04-09 MED ORDER — DIPHENHYDRAMINE HCL 12.5 MG/5ML PO ELIX: 12.5000 mg | ORAL_SOLUTION | ORAL | Status: DC | PRN

## 2016-04-09 MED ORDER — TETRACAINE HCL 1 % IJ SOLN
INTRAMUSCULAR | Status: AC
  Filled 2016-04-09: qty 2

## 2016-04-09 MED ORDER — DEXAMETHASONE SODIUM PHOSPHATE 10 MG/ML IJ SOLN
INTRAMUSCULAR | Status: AC
  Filled 2016-04-09: qty 1

## 2016-04-09 MED ORDER — PSYLLIUM 95 % PO PACK
1.0000 | PACK | Freq: Every day | ORAL | Status: DC
  Administered 2016-04-09 – 2016-04-10 (×2): 1 via ORAL
  Filled 2016-04-09 (×2): qty 1

## 2016-04-09 MED ORDER — ONDANSETRON HCL 4 MG PO TABS: 4.0000 mg | ORAL_TABLET | Freq: Four times a day (QID) | ORAL | Status: DC | PRN

## 2016-04-09 MED ORDER — SODIUM CHLORIDE 0.9 % IV SOLN
INTRAVENOUS | Status: DC
  Administered 2016-04-09 – 2016-04-10 (×2): via INTRAVENOUS

## 2016-04-09 MED ORDER — ADULT MULTIVITAMIN W/MINERALS CH
1.0000 | ORAL_TABLET | Freq: Every day | ORAL | Status: DC
  Administered 2016-04-09 – 2016-04-11 (×3): 1 via ORAL
  Filled 2016-04-09 (×3): qty 1

## 2016-04-09 MED ORDER — FENTANYL CITRATE (PF) 100 MCG/2ML IJ SOLN
INTRAMUSCULAR | Status: AC
  Filled 2016-04-09: qty 2

## 2016-04-09 MED ORDER — FLEET ENEMA 7-19 GM/118ML RE ENEM: 1.0000 | ENEMA | Freq: Once | RECTAL | Status: DC | PRN

## 2016-04-09 MED ORDER — ACETAMINOPHEN 10 MG/ML IV SOLN
INTRAVENOUS | Status: AC
  Filled 2016-04-09: qty 100

## 2016-04-09 MED ORDER — PHENYLEPHRINE HCL 10 MG/ML IJ SOLN
INTRAMUSCULAR | Status: DC | PRN
  Administered 2016-04-09: 10 ug/min via INTRAVENOUS

## 2016-04-09 MED ORDER — PROPOFOL 500 MG/50ML IV EMUL
INTRAVENOUS | Status: DC | PRN
  Administered 2016-04-09: 70 ug/kg/min via INTRAVENOUS

## 2016-04-09 MED ORDER — MIDAZOLAM HCL 2 MG/2ML IJ SOLN
INTRAMUSCULAR | Status: AC
  Filled 2016-04-09: qty 2

## 2016-04-09 MED ORDER — PROPOFOL 500 MG/50ML IV EMUL
INTRAVENOUS | Status: AC
Start: 2016-04-09 — End: 2016-04-09
  Filled 2016-04-09: qty 50

## 2016-04-09 MED ORDER — MENTHOL 3 MG MT LOZG
1.0000 | LOZENGE | OROMUCOSAL | Status: DC | PRN
  Filled 2016-04-09: qty 9

## 2016-04-09 MED ORDER — PROPOFOL 10 MG/ML IV BOLUS
INTRAVENOUS | Status: DC | PRN
  Administered 2016-04-09 (×2): 10 mg via INTRAVENOUS

## 2016-04-09 MED ORDER — BUPIVACAINE LIPOSOME 1.3 % IJ SUSP
INTRAMUSCULAR | Status: DC | PRN
  Administered 2016-04-09: 60 mL

## 2016-04-09 MED ORDER — CEFAZOLIN SODIUM-DEXTROSE 2-3 GM-% IV SOLR
2.0000 g | Freq: Four times a day (QID) | INTRAVENOUS | Status: AC
  Administered 2016-04-09 – 2016-04-10 (×4): 2 g via INTRAVENOUS
  Filled 2016-04-09 (×4): qty 50

## 2016-04-09 MED ORDER — BUPIVACAINE HCL (PF) 0.25 % IJ SOLN
INTRAMUSCULAR | Status: DC | PRN
  Administered 2016-04-09: 60 mL via INTRA_ARTICULAR

## 2016-04-09 MED ORDER — NEOMYCIN-POLYMYXIN B GU 40-200000 IR SOLN
Status: DC | PRN
  Administered 2016-04-09: 16 mL

## 2016-04-09 MED ORDER — ALPRAZOLAM 0.25 MG PO TABS
0.2500 mg | ORAL_TABLET | Freq: Every day | ORAL | Status: DC
  Administered 2016-04-09 – 2016-04-10 (×2): 0.25 mg via ORAL
  Filled 2016-04-09 (×2): qty 1

## 2016-04-09 MED ORDER — SODIUM CHLORIDE 0.9 % IJ SOLN
INTRAMUSCULAR | Status: AC
  Filled 2016-04-09: qty 50

## 2016-04-09 MED ORDER — ONDANSETRON HCL 4 MG/2ML IJ SOLN
4.0000 mg | Freq: Four times a day (QID) | INTRAMUSCULAR | Status: DC | PRN
  Administered 2016-04-10: 4 mg via INTRAVENOUS
  Filled 2016-04-09: qty 2

## 2016-04-09 MED ORDER — LIDOCAINE HCL (PF) 2 % IJ SOLN
INTRAMUSCULAR | Status: AC
  Filled 2016-04-09: qty 2

## 2016-04-09 MED ORDER — FENTANYL CITRATE (PF) 100 MCG/2ML IJ SOLN
INTRAMUSCULAR | Status: AC
  Administered 2016-04-09: 25 ug via INTRAVENOUS
  Filled 2016-04-09: qty 2

## 2016-04-09 MED ORDER — CEFAZOLIN SODIUM-DEXTROSE 2-3 GM-% IV SOLR
INTRAVENOUS | Status: DC | PRN
  Administered 2016-04-09: 2 g via INTRAVENOUS

## 2016-04-09 MED ORDER — SENNOSIDES-DOCUSATE SODIUM 8.6-50 MG PO TABS
1.0000 | ORAL_TABLET | Freq: Two times a day (BID) | ORAL | Status: DC
  Administered 2016-04-09 – 2016-04-11 (×4): 1 via ORAL
  Filled 2016-04-09 (×4): qty 1

## 2016-04-09 MED ORDER — OMEGA-3-ACID ETHYL ESTERS 1 G PO CAPS
1.0000 g | ORAL_CAPSULE | Freq: Two times a day (BID) | ORAL | Status: DC
  Administered 2016-04-09 – 2016-04-11 (×4): 1 g via ORAL
  Filled 2016-04-09 (×4): qty 1

## 2016-04-09 MED ORDER — ATENOLOL 25 MG PO TABS
25.0000 mg | ORAL_TABLET | Freq: Every day | ORAL | Status: DC
  Administered 2016-04-09 – 2016-04-10 (×2): 25 mg via ORAL
  Filled 2016-04-09 (×2): qty 1

## 2016-04-09 MED ORDER — BUPIVACAINE HCL (PF) 0.5 % IJ SOLN
INTRAMUSCULAR | Status: DC | PRN
  Administered 2016-04-09: 2 mL

## 2016-04-09 MED ORDER — ALUM & MAG HYDROXIDE-SIMETH 200-200-20 MG/5ML PO SUSP: 30.0000 mL | ORAL | Status: DC | PRN

## 2016-04-09 MED ORDER — PROPOFOL 500 MG/50ML IV EMUL
INTRAVENOUS | Status: AC
  Filled 2016-04-09: qty 50

## 2016-04-09 MED ORDER — CEFAZOLIN SODIUM-DEXTROSE 2-4 GM/100ML-% IV SOLN: 2.0000 g | Freq: Four times a day (QID) | INTRAVENOUS | Status: DC

## 2016-04-09 MED ORDER — OXYCODONE HCL 5 MG PO TABS
5.0000 mg | ORAL_TABLET | ORAL | Status: DC | PRN
  Administered 2016-04-09: 5 mg via ORAL
  Administered 2016-04-09 – 2016-04-11 (×6): 10 mg via ORAL
  Filled 2016-04-09 (×7): qty 2

## 2016-04-09 MED ORDER — FERROUS SULFATE 325 (65 FE) MG PO TABS
325.0000 mg | ORAL_TABLET | Freq: Two times a day (BID) | ORAL | Status: DC
  Administered 2016-04-09 – 2016-04-11 (×4): 325 mg via ORAL
  Filled 2016-04-09 (×4): qty 1

## 2016-04-09 MED ORDER — TRANEXAMIC ACID 1000 MG/10ML IV SOLN
INTRAVENOUS | Status: DC | PRN
  Administered 2016-04-09: 1000 mg via INTRAVENOUS

## 2016-04-09 MED ORDER — ACETAMINOPHEN 10 MG/ML IV SOLN
1000.0000 mg | Freq: Four times a day (QID) | INTRAVENOUS | Status: AC
  Administered 2016-04-09 – 2016-04-10 (×4): 1000 mg via INTRAVENOUS
  Filled 2016-04-09 (×4): qty 100

## 2016-04-09 MED ORDER — POLYETHYL GLYCOL-PROPYL GLYCOL 0.4-0.3 % OP GEL
Freq: Two times a day (BID) | OPHTHALMIC | Status: DC
  Administered 2016-04-09 – 2016-04-10 (×3): via OPHTHALMIC
  Filled 2016-04-09: qty 10

## 2016-04-09 MED ORDER — CHLORHEXIDINE GLUCONATE 4 % EX LIQD
60.0000 mL | Freq: Once | CUTANEOUS | Status: DC
Start: 2016-04-09 — End: 2016-04-09

## 2016-04-09 MED ORDER — CALTRATE 600+D PLUS MINERALS 600-800 MG-UNIT PO CHEW: CHEWABLE_TABLET | Freq: Two times a day (BID) | ORAL | Status: DC

## 2016-04-09 MED ORDER — FENTANYL CITRATE (PF) 100 MCG/2ML IJ SOLN
INTRAMUSCULAR | Status: DC | PRN
  Administered 2016-04-09: 50 ug via INTRAVENOUS

## 2016-04-09 MED ORDER — MEPERIDINE HCL 50 MG/ML IJ SOLN: 6.2500 mg | INTRAMUSCULAR | Status: DC | PRN

## 2016-04-09 MED ORDER — ENOXAPARIN SODIUM 30 MG/0.3ML ~~LOC~~ SOLN
30.0000 mg | Freq: Two times a day (BID) | SUBCUTANEOUS | Status: DC
  Administered 2016-04-10 – 2016-04-11 (×3): 30 mg via SUBCUTANEOUS
  Filled 2016-04-09 (×3): qty 0.3

## 2016-04-09 MED ORDER — BUPIVACAINE HCL (PF) 0.25 % IJ SOLN
INTRAMUSCULAR | Status: AC
  Filled 2016-04-09: qty 60

## 2016-04-09 MED ORDER — PROMETHAZINE HCL 25 MG/ML IJ SOLN: 6.2500 mg | INTRAMUSCULAR | Status: DC | PRN

## 2016-04-09 MED ORDER — BISACODYL 10 MG RE SUPP
10.0000 mg | Freq: Every day | RECTAL | Status: DC | PRN
  Administered 2016-04-11: 10 mg via RECTAL
  Filled 2016-04-09: qty 1

## 2016-04-09 MED ORDER — MIDAZOLAM HCL 5 MG/5ML IJ SOLN
INTRAMUSCULAR | Status: DC | PRN
  Administered 2016-04-09: 0.5 mg via INTRAVENOUS
  Administered 2016-04-09: 1 mg via INTRAVENOUS
  Administered 2016-04-09: 0.5 mg via INTRAVENOUS

## 2016-04-09 MED ORDER — NEOMYCIN-POLYMYXIN B GU 40-200000 IR SOLN
Status: AC
  Filled 2016-04-09: qty 20

## 2016-04-09 MED ORDER — MAGNESIUM HYDROXIDE 400 MG/5ML PO SUSP
30.0000 mL | Freq: Every day | ORAL | Status: DC | PRN
  Administered 2016-04-10: 30 mL via ORAL
  Filled 2016-04-09: qty 30

## 2016-04-09 MED ORDER — TRAMADOL HCL 50 MG PO TABS
50.0000 mg | ORAL_TABLET | ORAL | Status: DC | PRN
  Administered 2016-04-10 – 2016-04-11 (×4): 100 mg via ORAL
  Filled 2016-04-09 (×4): qty 2

## 2016-04-09 MED ORDER — ACETAMINOPHEN 10 MG/ML IV SOLN
INTRAVENOUS | Status: DC | PRN
  Administered 2016-04-09: 1000 mg via INTRAVENOUS

## 2016-04-09 MED ORDER — PANTOPRAZOLE SODIUM 40 MG PO TBEC
40.0000 mg | DELAYED_RELEASE_TABLET | Freq: Two times a day (BID) | ORAL | Status: DC
  Administered 2016-04-09 – 2016-04-11 (×4): 40 mg via ORAL
  Filled 2016-04-09 (×4): qty 1

## 2016-04-09 MED ORDER — METOCLOPRAMIDE HCL 10 MG PO TABS
10.0000 mg | ORAL_TABLET | Freq: Three times a day (TID) | ORAL | Status: DC
  Administered 2016-04-09 – 2016-04-11 (×6): 10 mg via ORAL
  Filled 2016-04-09 (×6): qty 1

## 2016-04-09 MED ORDER — RISAQUAD PO CAPS
1.0000 | ORAL_CAPSULE | Freq: Every day | ORAL | Status: DC
  Administered 2016-04-10 – 2016-04-11 (×2): 1 via ORAL
  Filled 2016-04-09 (×4): qty 1

## 2016-04-09 MED ORDER — BIOTIN 2.5 MG PO TABS: 2.5000 mg | ORAL_TABLET | Freq: Two times a day (BID) | ORAL | Status: DC

## 2016-04-09 MED ORDER — B COMPLEX-C PO TABS
1.0000 | ORAL_TABLET | Freq: Every day | ORAL | Status: DC
  Administered 2016-04-10: 1 via ORAL
  Filled 2016-04-09 (×2): qty 1

## 2016-04-09 MED ORDER — FENTANYL CITRATE (PF) 100 MCG/2ML IJ SOLN
25.0000 ug | INTRAMUSCULAR | Status: DC | PRN
  Administered 2016-04-09 (×4): 25 ug via INTRAVENOUS

## 2016-04-09 MED ORDER — MORPHINE SULFATE (PF) 2 MG/ML IV SOLN
2.0000 mg | INTRAVENOUS | Status: DC | PRN
  Administered 2016-04-09 – 2016-04-10 (×3): 2 mg via INTRAVENOUS
  Filled 2016-04-09 (×3): qty 1

## 2016-04-09 SURGICAL SUPPLY — 61 items
BATTERY INSTRU NAVIGATION (MISCELLANEOUS) ×12 IMPLANT
BLADE SAW 1 (BLADE) ×3 IMPLANT
BLADE SAW 1/2 (BLADE) ×3 IMPLANT
BLADE SAW 70X12.5 (BLADE) IMPLANT
CANISTER SUCT 1200ML W/VALVE (MISCELLANEOUS) ×3 IMPLANT
CANISTER SUCT 3000ML (MISCELLANEOUS) ×6 IMPLANT
CAP KNEE TOTAL 3 SIGMA ×3 IMPLANT
CATH TRAY METER 16FR LF (MISCELLANEOUS) ×3 IMPLANT
CEMENT HV SMART SET (Cement) ×6 IMPLANT
COOLER POLAR GLACIER W/PUMP (MISCELLANEOUS) ×3 IMPLANT
CUFF TOURN 24 STER (MISCELLANEOUS) IMPLANT
CUFF TOURN 30 STER DUAL PORT (MISCELLANEOUS) ×3 IMPLANT
DRAPE SHEET LG 3/4 BI-LAMINATE (DRAPES) ×3 IMPLANT
DRSG DERMACEA 8X12 NADH (GAUZE/BANDAGES/DRESSINGS) ×3 IMPLANT
DRSG OPSITE POSTOP 4X14 (GAUZE/BANDAGES/DRESSINGS) ×3 IMPLANT
DRSG TEGADERM 4X4.75 (GAUZE/BANDAGES/DRESSINGS) ×3 IMPLANT
DURAPREP 26ML APPLICATOR (WOUND CARE) ×3 IMPLANT
ELECT CAUTERY BLADE 6.4 (BLADE) ×3 IMPLANT
ELECT REM PT RETURN 9FT ADLT (ELECTROSURGICAL) ×3
ELECTRODE REM PT RTRN 9FT ADLT (ELECTROSURGICAL) ×1 IMPLANT
EX-PIN ORTHOLOCK NAV 4X150 (PIN) ×6 IMPLANT
GLOVE BIOGEL M STRL SZ7.5 (GLOVE) ×6 IMPLANT
GLOVE BIOGEL PI IND STRL 9 (GLOVE) ×1 IMPLANT
GLOVE BIOGEL PI INDICATOR 9 (GLOVE) ×2
GLOVE INDICATOR 8.0 STRL GRN (GLOVE) ×3 IMPLANT
GLOVE SURG SYN 9.0  PF PI (GLOVE) ×2
GLOVE SURG SYN 9.0 PF PI (GLOVE) ×1 IMPLANT
GOWN STRL REUS W/ TWL LRG LVL3 (GOWN DISPOSABLE) ×2 IMPLANT
GOWN STRL REUS W/TWL 2XL LVL3 (GOWN DISPOSABLE) ×3 IMPLANT
GOWN STRL REUS W/TWL LRG LVL3 (GOWN DISPOSABLE) ×4
HANDPIECE INTERPULSE COAX TIP (DISPOSABLE) ×2
HEMOVAC 400CC 10FR (MISCELLANEOUS) ×3 IMPLANT
HOLDER FOLEY CATH W/STRAP (MISCELLANEOUS) ×3 IMPLANT
HOOD PEEL AWAY FLYTE STAYCOOL (MISCELLANEOUS) ×6 IMPLANT
KIT RM TURNOVER STRD PROC AR (KITS) ×3 IMPLANT
KNIFE SCULPS 14X20 (INSTRUMENTS) ×3 IMPLANT
LABEL OR SOLS (LABEL) ×3 IMPLANT
NDL SAFETY 18GX1.5 (NEEDLE) ×3 IMPLANT
NEEDLE SPNL 20GX3.5 QUINCKE YW (NEEDLE) ×3 IMPLANT
NS IRRIG 500ML POUR BTL (IV SOLUTION) ×3 IMPLANT
PACK TOTAL KNEE (MISCELLANEOUS) ×3 IMPLANT
PAD WRAPON POLAR KNEE (MISCELLANEOUS) ×1 IMPLANT
PIN DRILL QUICK PACK ×3 IMPLANT
PIN FIXATION 1/8DIA X 3INL (PIN) ×3 IMPLANT
SET HNDPC FAN SPRY TIP SCT (DISPOSABLE) ×1 IMPLANT
SOL .9 NS 3000ML IRR  AL (IV SOLUTION) ×2
SOL .9 NS 3000ML IRR UROMATIC (IV SOLUTION) ×1 IMPLANT
SOL PREP PVP 2OZ (MISCELLANEOUS) ×3
SOLUTION PREP PVP 2OZ (MISCELLANEOUS) ×1 IMPLANT
SPONGE DRAIN TRACH 4X4 STRL 2S (GAUZE/BANDAGES/DRESSINGS) ×3 IMPLANT
STAPLER SKIN PROX 35W (STAPLE) ×3 IMPLANT
SUCTION FRAZIER HANDLE 10FR (MISCELLANEOUS) ×2
SUCTION TUBE FRAZIER 10FR DISP (MISCELLANEOUS) ×1 IMPLANT
SUT VIC AB 0 CT1 36 (SUTURE) ×3 IMPLANT
SUT VIC AB 1 CT1 36 (SUTURE) ×6 IMPLANT
SUT VIC AB 2-0 CT2 27 (SUTURE) ×3 IMPLANT
SYR 20CC LL (SYRINGE) ×3 IMPLANT
SYR 30ML LL (SYRINGE) ×6 IMPLANT
TOWEL OR 17X26 4PK STRL BLUE (TOWEL DISPOSABLE) ×3 IMPLANT
TOWER CARTRIDGE SMART MIX (DISPOSABLE) ×3 IMPLANT
WRAPON POLAR PAD KNEE (MISCELLANEOUS) ×3

## 2016-04-09 NOTE — Anesthesia Post-op Follow-up Note (Cosign Needed)
Anesthesia QCDR form completed.        

## 2016-04-09 NOTE — Plan of Care (Signed)
Problem: Education: Goal: Understanding of discharge needs will improve Outcome: Progressing Pt was educated on expected plan of care including PT goals, pain control, mobility, and discharge planning.   Problem: Pain Management: Goal: Pain level will decrease with appropriate interventions Outcome: Progressing Educated on appropriate expectations for pain goals and control. Educated on prescribed pain medication regimen  Problem: Safety: Goal: Ability to remain free from injury will improve Outcome: Progressing Educated on safety measures to prevent falls and injury including non skid footwear when out of bed, call bell in reach, toileting schedule, pain management, need to have assistance when out of bed at all times.

## 2016-04-09 NOTE — Transfer of Care (Signed)
Immediate Anesthesia Transfer of Care Note  Patient: Donna Jefferson  Procedure(s) Performed: Procedure(s): COMPUTER ASSISTED TOTAL KNEE ARTHROPLASTY (Left)  Patient Location: PACU  Anesthesia Type:General  Level of Consciousness: awake and patient cooperative  Airway & Oxygen Therapy: Patient Spontanous Breathing and Patient connected to face mask oxygen  Post-op Assessment: Report given to RN and Post -op Vital signs reviewed and stable  Post vital signs: Reviewed and stable  Last Vitals:  Vitals:   04/09/16 1005 04/09/16 1619  BP: (!) 159/70 119/57  Pulse: 81 71  Resp: 16 16  Temp: 36.6 C (P) 36.4 C    Last Pain:  Vitals:   04/09/16 1619  TempSrc:   PainSc: (P) 0-No pain         Complications: No apparent anesthesia complications

## 2016-04-09 NOTE — Anesthesia Procedure Notes (Addendum)
Spinal  Patient location during procedure: OR Start time: 04/09/2016 12:20 PM End time: 04/09/2016 12:25 PM Staffing Anesthesiologist: Emmie Niemann Performed: anesthesiologist  Preanesthetic Checklist Completed: patient identified, site marked, surgical consent, pre-op evaluation, timeout performed, IV checked, risks and benefits discussed and monitors and equipment checked Spinal Block Patient position: sitting Prep: ChloraPrep Patient monitoring: heart rate, continuous pulse ox, blood pressure and cardiac monitor Approach: midline Location: L4-5 Injection technique: single-shot Needle Needle type: Introducer and Pencil-Tip  Needle gauge: 24 G Needle length: 9 cm Additional Notes Negative paresthesia. Negative blood return. Positive free-flowing CSF. Expiration date of kit checked and confirmed. Patient tolerated procedure well, without complications.

## 2016-04-09 NOTE — Op Note (Signed)
OPERATIVE NOTE  DATE OF SURGERY:  04/09/2016  PATIENT NAME:  Donna Jefferson   DOB: 07-09-1940  MRN: [id number]  PRE-OPERATIVE DIAGNOSIS: Degenerative arthrosis of the left knee, primary  POST-OPERATIVE DIAGNOSIS:  Same  PROCEDURE:  Left total knee arthroplasty using computer-assisted navigation  SURGEON:  Marciano Sequin. M.D.  ASSISTANT:  Vance Peper, PA (present and scrubbed throughout the case, critical for assistance with exposure, retraction, instrumentation, and closure)  ANESTHESIA: spinal  ESTIMATED BLOOD LOSS: 50 mL  FLUIDS REPLACED: 1500 mL of crystalloid  TOURNIQUET TIME: 101 minutes  DRAINS: 2 medium drains to a reinfusion system  SOFT TISSUE RELEASES: Anterior cruciate ligament, posterior cruciate ligament, deep and superficial medial collateral ligament, patellofemoral ligament  IMPLANTS UTILIZED: DePuy PFC Sigma size 3 posterior stabilized femoral component (cemented), size 2.5 MBT tibial component (cemented), 32 mm 3 peg oval dome patella (cemented), and a 10 mm stabilized rotating platform polyethylene insert.  INDICATIONS FOR SURGERY: Donna Jefferson is a 76 y.o. year old female with a long history of progressive knee pain. X-rays demonstrated severe degenerative changes in tricompartmental fashion. The patient had not seen any significant improvement despite conservative nonsurgical intervention. After discussion of the risks and benefits of surgical intervention, the patient expressed understanding of the risks benefits and agree with plans for total knee arthroplasty.   The risks, benefits, and alternatives were discussed at length including but not limited to the risks of infection, bleeding, nerve injury, stiffness, blood clots, the need for revision surgery, cardiopulmonary complications, among others, and they were willing to proceed.  PROCEDURE IN DETAIL: The patient was brought into the operating room and, after adequate spinal anesthesia was achieved,  a tourniquet was placed on the patient's upper thigh. The patient's knee and leg were cleaned and prepped with alcohol and DuraPrep and draped in the usual sterile fashion. A "timeout" was performed as per usual protocol. The lower extremity was exsanguinated using an Esmarch, and the tourniquet was inflated to 300 mmHg. An anterior longitudinal incision was made followed by a standard mid vastus approach. The deep fibers of the medial collateral ligament were elevated in a subperiosteal fashion off of the medial flare of the tibia so as to maintain a continuous soft tissue sleeve. The patella was subluxed laterally and the patellofemoral ligament was incised. Inspection of the knee demonstrated severe degenerative changes with full-thickness loss of articular cartilage. Osteophytes were debrided using a rongeur. Anterior and posterior cruciate ligaments were excised. Two 4.0 mm Schanz pins were inserted in the femur and into the tibia for attachment of the array of trackers used for computer-assisted navigation. Hip center was identified using a circumduction technique. Distal landmarks were mapped using the computer. The distal femur and proximal tibia were mapped using the computer. The distal femoral cutting guide was positioned using computer-assisted navigation so as to achieve a 5 distal valgus cut. The femur was sized and it was felt that a size 3 femoral component was appropriate. A size 3 femoral cutting guide was positioned and the anterior cut was performed and verified using the computer. This was followed by completion of the posterior and chamfer cuts. Femoral cutting guide for the central box was then positioned in the center box cut was performed.  Attention was then directed to the proximal tibia. Medial and lateral menisci were excised. The extramedullary tibial cutting guide was positioned using computer-assisted navigation so as to achieve a 0 varus-valgus alignment and 0 posterior slope.  The cut was performed and  verified using the computer. The proximal tibia was sized and it was felt that a size 2.5 tibial tray was appropriate. Tibial and femoral trials were inserted followed by insertion of a 10 mm polyethylene insert. The knee was felt to be tight medially. A Cobb elevator was used to elevate the superficial fibers of the medial collateral ligament.  This allowed for excellent mediolateral soft tissue balancing both in flexion and in full extension. Finally, the patella was cut and prepared so as to accommodate a 32 mm 3 peg oval dome patella. A patella trial was placed and the knee was placed through a range of motion with excellent patellar tracking appreciated. The femoral trial was removed after debridement of posterior osteophytes. The central post-hole for the tibial component was reamed followed by insertion of a keel punch. Tibial trials were then removed. Cut surfaces of bone were irrigated with copious amounts of normal saline with antibiotic solution using pulsatile lavage and then suctioned dry. Polymethylmethacrylate cement was prepared in the usual fashion using a vacuum mixer. Cement was applied to the cut surface of the proximal tibia as well as along the undersurface of a size 2.5 MBT tibial component. Tibial component was positioned and impacted into place. Excess cement was removed using [employer] fast streamer. Cement was then applied to the cut surfaces of the femur as well as along the posterior flanges of the size 3 femoral component. The femoral component was positioned and impacted into place. Excess cement was removed using [employer] fast streamer. A 10 mm polyethylene trial was inserted and the knee was brought into full extension with steady axial compression applied. Finally, cement was applied to the backside of a 32 mm 3 peg oval dome patella and the patellar component was positioned and patellar clamp applied. Excess cement was removed using [employer] fast streamer. After adequate curing  of the cement, the tourniquet was deflated after a total tourniquet time of 101 minutes. Hemostasis was achieved using electrocautery. The knee was irrigated with copious amounts of normal saline with antibiotic solution using pulsatile lavage and then suctioned dry. 20 mL of 1.3% Exparel and 60 mL of 0.25% Marcaine in 40 mL of normal saline was injected along the posterior capsule, medial and lateral gutters, and along the arthrotomy site. A 10 mm stabilized rotating platform polyethylene insert was inserted and the knee was placed through a range of motion with excellent mediolateral soft tissue balancing appreciated and excellent patellar tracking noted. 2 medium drains were placed in the wound bed and brought out through separate stab incisions to be attached to a reinfusion system. The medial parapatellar portion of the incision was reapproximated using interrupted sutures of #1 Vicryl. Subcutaneous tissue was approximated in layers using first #0 Vicryl followed #2-0 Vicryl. The skin was approximated with skin staples. A sterile dressing was applied.  The patient tolerated the procedure well and was transported to the recovery room in stable condition.    Grafton Warzecha P. Holley Bouche., M.D.

## 2016-04-09 NOTE — Anesthesia Preprocedure Evaluation (Signed)
Anesthesia Evaluation  Patient identified by MRN, date of birth, ID band Patient awake    Reviewed: Allergy & Precautions, NPO status , Patient's Chart, lab work & pertinent test results  History of Anesthesia Complications Negative for: history of anesthetic complications  Airway Mallampati: II  TM Distance: >3 FB Neck ROM: Full    Dental  (+) Implants, Caps   Pulmonary neg pulmonary ROS, neg sleep apnea, neg COPD,    breath sounds clear to auscultation- rhonchi (-) wheezing      Cardiovascular Exercise Tolerance: Good (-) hypertension(-) CAD and (-) Past MI  Rhythm:Regular Rate:Normal - Systolic murmurs and - Diastolic murmurs    Neuro/Psych  Headaches, negative psych ROS   GI/Hepatic Neg liver ROS, GERD  ,  Endo/Other  negative endocrine ROSneg diabetes  Renal/GU negative Renal ROS     Musculoskeletal  (+) Arthritis ,   Abdominal (+) - obese,   Peds  Hematology negative hematology ROS (+)   Anesthesia Other Findings Past Medical History: No date: Arthritis     Comment: osteoarthritis No date: Cancer (HCC)     Comment: squamous cell skin cancer No date: GERD (gastroesophageal reflux disease) No date: Headache     Comment: migraines   Reproductive/Obstetrics                             Anesthesia Physical Anesthesia Plan  ASA: II  Anesthesia Plan: Spinal   Post-op Pain Management:    Induction:   Airway Management Planned: Natural Airway  Additional Equipment:   Intra-op Plan:   Post-operative Plan:   Informed Consent: I have reviewed the patients History and Physical, chart, labs and discussed the procedure including the risks, benefits and alternatives for the proposed anesthesia with the patient or authorized representative who has indicated his/her understanding and acceptance.   Dental advisory given  Plan Discussed with: Anesthesiologist and CRNA  Anesthesia  Plan Comments:         Lab Results  Component Value Date   WBC 7.6 03/26/2016   HGB 13.3 03/26/2016   HCT 39.1 03/26/2016   MCV 91.6 03/26/2016   PLT 260 03/26/2016    Anesthesia Quick Evaluation

## 2016-04-09 NOTE — H&P (Signed)
The patient has been re-examined, and the chart reviewed, and there have been no interval changes to the documented history and physical.    The risks, benefits, and alternatives have been discussed at length. The patient expressed understanding of the risks benefits and agreed with plans for surgical intervention.  James P. Hooten, Jr. M.D.    

## 2016-04-10 ENCOUNTER — Encounter: Payer: Self-pay | Admitting: Orthopedic Surgery

## 2016-04-10 LAB — BASIC METABOLIC PANEL
ANION GAP: 6 (ref 5–15)
BUN: 10 mg/dL (ref 6–20)
CALCIUM: 8.3 mg/dL — AB (ref 8.9–10.3)
CO2: 25 mmol/L (ref 22–32)
Chloride: 102 mmol/L (ref 101–111)
Creatinine, Ser: 0.54 mg/dL (ref 0.44–1.00)
GFR calc Af Amer: >60 mL/min (ref >60)
GFR calc non Af Amer: >60 mL/min (ref >60)
GLUCOSE: 120 mg/dL — AB (ref 65–99)
POTASSIUM: 3.9 mmol/L (ref 3.5–5.1)
Sodium: 133 mmol/L — ABNORMAL LOW (ref 135–145)

## 2016-04-10 LAB — CBC
HEMATOCRIT: 32.1 % — AB (ref 35.0–47.0)
Hemoglobin: 11 g/dL — ABNORMAL LOW (ref 12.0–16.0)
MCH: 31.3 pg (ref 26.0–34.0)
MCHC: 34.3 g/dL (ref 32.0–36.0)
MCV: 91.4 fL (ref 80.0–100.0)
Platelets: 215 10*3/uL (ref 150–440)
RBC: 3.51 MIL/uL — ABNORMAL LOW (ref 3.80–5.20)
RDW: 13.5 % (ref 11.5–14.5)
WBC: 9.6 10*3/uL (ref 3.6–11.0)

## 2016-04-10 NOTE — Evaluation (Signed)
Occupational Therapy Evaluation Patient Details Name: Donna Jefferson MRN: [id number] DOB: August 21, 1940 Today's Date: 04/10/2016    History of Present Illness Pt is a 76 yo female s/p L TKR WBAT. PMH includes: R TKR, migraines, OA and skin cancer   Clinical Impression   Pt seen for OT evaluation this date. Pt was independent with ADL, IADL, driving, participating in exercise classes 3x/wk, and with busy social life/strong social support system and is eager to return to PLOF. Pt presents with pain, decreased ROM/strength/activity tolerance and need for assistance with self care tasks. Pt would benefit from skilled OT services in order to maximize functional return to PLOF and minimize risk of falls, including AE for self care and ECS.     Follow Up Recommendations  No OT follow up    Equipment Recommendations  None recommended by OT    Recommendations for Other Services       Precautions / Restrictions Precautions Precautions: Fall;Knee Precaution Comments: did not need KI for OT evaluation Required Braces or Orthoses: Knee Immobilizer - Left Knee Immobilizer - Left: Discontinue once straight leg raise with < 10 degree lag Restrictions Weight Bearing Restrictions: Yes LLE Weight Bearing: Weight bearing as tolerated      Mobility Bed Mobility Overal bed mobility: Needs Assistance Bed Mobility: Supine to Sit;Sit to Supine     Supine to sit: HOB elevated;Supervision Sit to supine: Modified independent (Device/Increase time);HOB elevated   General bed mobility comments: good safe technique  Transfers Overall transfer level: Needs assistance Equipment used: Rolling walker (2 wheeled) Transfers: Sit to/from Stand Sit to Stand: Min guard         General transfer comment: cues for hand placement to maximize safety    Balance Overall balance assessment: Needs assistance Sitting-balance support: Feet supported;No upper extremity supported Sitting balance-Leahy Scale:  Good Sitting balance - Comments: able to maintain sitting w/o back support   Standing balance support: Bilateral upper extremity supported Standing balance-Leahy Scale: Good Standing balance comment: requires use of RW in standing, L knee slightly flexed throughout                            ADL Overall ADL's : Needs assistance/impaired Eating/Feeding: Set up;Sitting   Grooming: Set up;Sitting   Upper Body Bathing: Sitting;Set up   Lower Body Bathing: Sit to/from stand;Sitting/lateral leans;Set up;Supervison/ safety   Upper Body Dressing : Set up;Sitting   Lower Body Dressing: Set up;Min guard;Sit to/from stand;Sitting/lateral leans;With adaptive equipment   Toilet Transfer: RW;Comfort height toilet;Min guard;Cueing for Office manager Details (indicate cue type and reason): cues for hand placement to maximize safety Toileting- Clothing Manipulation and Hygiene: Sitting/lateral lean;Independent       Functional mobility during ADLs: Min guard;Rolling walker General ADL Comments: generally supervision level for LB ADL, pt familiar with AE for LB ADL from previous knee surgery     Vision Baseline Vision/History: Wears glasses Wears Glasses: At all times Patient Visual Report: No change from baseline Vision Assessment?: No apparent visual deficits     Perception     Praxis Praxis Praxis tested?: Within functional limits    Pertinent Vitals/Pain Pain Assessment: 0-10 Pain Score: 5  Pain Location: L knee during mobility Pain Descriptors / Indicators: Aching Pain Intervention(s): Limited activity within patient's tolerance;Monitored during session;Ice applied;Repositioned     Hand Dominance     Extremity/Trunk Assessment Upper Extremity Assessment Upper Extremity Assessment: Overall WFL for tasks assessed   Lower  Extremity Assessment Lower Extremity Assessment: Defer to PT evaluation;LLE deficits/detail LLE Deficits / Details: grossly 4/5 for hip  and knee movements, 5/5 DF and PF, increased pain and limited ROM    Cervical / Trunk Assessment Cervical / Trunk Assessment: Normal   Communication Communication Communication: No difficulties   Cognition Arousal/Alertness: Awake/alert Behavior During Therapy: WFL for tasks assessed/performed Overall Cognitive Status: Within Functional Limits for tasks assessed                     General Comments       Exercises       Shoulder Instructions      Home Living Family/patient expects to be discharged to:: Private residence Living Arrangements: Spouse/significant other Available Help at Discharge: Family;Available 24 hours/day Type of Home: House Home Access: Stairs to enter [employer] of Steps: 3 Entrance Stairs-Rails: None Home Layout: One level     Bathroom Shower/Tub: Occupational psychologist: Handicapped height Bathroom Accessibility: Yes How Accessible: Accessible via walker;Accessible via wheelchair Home Equipment: Shower seat - built in;Bedside commode;Hand held shower head;Grab bars - tub/shower;Grab bars - toilet;Adaptive equipment Adaptive Equipment: Reacher;Sock aid Additional Comments: newly renovated bathroom to support aging in place      Prior Functioning/Environment Level of Independence: Independent        Comments: pt independent in all ADLs and IADLs, is retired Education officer, 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, drives and cares for herself, [employer] 3x/wk with spouse, no falls in past 12 mo reported, active social life/support system        OT Problem List: Decreased strength;Pain;Decreased range of motion;Decreased safety awareness;Decreased activity tolerance;Decreased knowledge of use of DME or AE;Impaired balance (sitting and/or standing)      OT Treatment/Interventions: Self-care/ADL training;Therapeutic exercise;Energy conservation;DME and/or AE instruction;Patient/family education    OT Goals(Current goals can be found  in the care plan section) Acute Rehab OT Goals Patient Stated Goal: Return home  OT Goal Formulation: With patient Time For Goal Achievement: 04/17/16 Potential to Achieve Goals: Good  OT Frequency: Min 1X/week   Barriers to D/C:            Co-evaluation              End of Session Equipment Utilized During Treatment: Gait belt;Rolling walker  Activity Tolerance: Patient tolerated treatment well Patient left: in bed;with call bell/phone within reach;with bed alarm set;with SCD's reapplied;Other (comment) (polar care in place)  OT Visit Diagnosis: Other abnormalities of gait and mobility (R26.89);Muscle weakness (generalized) (M62.81);Pain Pain - Right/Left: Left Pain - part of body: Knee                ADL either performed or assessed with clinical judgement  Time: 8032-1224 OT Time Calculation (min): 30 min Charges:  OT General Charges $OT Visit: 1 Procedure OT Evaluation $OT Eval Low Complexity: 1 Procedure OT Treatments $Self Care/Home Management : 8-22 mins G-Codes: OT G-codes **NOT FOR INPATIENT CLASS** Functional Limitation: Self care Self Care Current Status (M2500): At least 20 percent but less than 40 percent impaired, limited or restricted Self Care Goal Status (B7048): At least 1 percent but less than 20 percent impaired, limited or restricted   Jeni Salles, MPH, MS, OTR/L ascom [phone] 04/10/16, 1:04 PM

## 2016-04-10 NOTE — Discharge Summary (Signed)
Physician Discharge Summary  Patient ID: Donna Jefferson MRN: [id number] DOB/AGE: 06-27-1940 76 y.o.  Admit date: 04/09/2016 Discharge date: 04/11/2016  Admission Diagnoses:  primary osteoarthritis of left knee   Discharge Diagnoses: Patient Active Problem List   Diagnosis Date Noted  . S/P total knee arthroplasty 04/09/2016  . Primary osteoarthritis of left knee 02/24/2016  . Migraine 07/12/2013  . Sleep disturbance 07/12/2013    Past Medical History:  Diagnosis Date  . Arthritis    osteoarthritis  . Cancer (HCC)    squamous cell skin cancer  . GERD (gastroesophageal reflux disease)   . Headache    migraines     Transfusion: Transfusion during this admission   Consultants (if any):  none  Discharged Condition: Improved  Hospital Course: Donna Jefferson is an 76 y.o. female who was admitted 04/09/2016 with a diagnosis of degenerative arthrosis left knee and went to the operating room on 04/09/2016 and underwent the above named procedures.    Surgeries:Procedure(s): COMPUTER ASSISTED TOTAL KNEE ARTHROPLASTY on 04/09/2016  PRE-OPERATIVE DIAGNOSIS: Degenerative arthrosis of the left knee, primary  POST-OPERATIVE DIAGNOSIS:  Same  PROCEDURE:  Left total knee arthroplasty using computer-assisted navigation  SURGEON:  Marciano Sequin. M.D.  ASSISTANT:  Vance Peper, PA (present and scrubbed throughout the case, critical for assistance with exposure, retraction, instrumentation, and closure)  ANESTHESIA: spinal  ESTIMATED BLOOD LOSS: 50 mL  FLUIDS REPLACED: 1500 mL of crystalloid  TOURNIQUET TIME: 101 minutes  DRAINS: 2 medium drains to a reinfusion system  SOFT TISSUE RELEASES: Anterior cruciate ligament, posterior cruciate ligament, deep and superficial medial collateral ligament, patellofemoral ligament  IMPLANTS UTILIZED: DePuy PFC Sigma size 3 posterior stabilized femoral component (cemented), size 2.5 MBT tibial component (cemented), 32 mm 3  peg oval dome patella (cemented), and a 10 mm stabilized rotating platform polyethylene insert.  INDICATIONS FOR SURGERY: Donna Jefferson is a 76 y.o. year old female with a long history of progressive knee pain. X-rays demonstrated severe degenerative changes in tricompartmental fashion. The patient had not seen any significant improvement despite conservative nonsurgical intervention. After discussion of the risks and benefits of surgical intervention, the patient expressed understanding of the risks benefits and agree with plans for total knee arthroplasty.   The risks, benefits, and alternatives were discussed at length including but not limited to the risks of infection, bleeding, nerve injury, stiffness, blood clots, the need for revision surgery, cardiopulmonary complications, among others, and they were willing to proceed.  Patient tolerated the surgery well. No complications .Patient was taken to PACU where she was stabilized and then transferred to the orthopedic floor.  Patient started on Lovenox 30 mg q 12 hrs. Foot pumps applied bilaterally at 80 mm hgb. Heels elevated off bed with rolled towels. No evidence of DVT. Calves non tender. Negative [place]. Physical therapy started on day #1 for gait training and transfer with OT starting on  day #1 for ADL and assisted devices. Patient has done well with therapy. Ambulated greater than 200 feet upon being discharged. Was able to ascend and descend 4 steps safely and independently  Patient's IV and Foley were discontinued on day #1 with Hemovac being discontinued on day #2. Dressing was changed on day #2 prior to patient being discharged   She was given perioperative antibiotics:  Anti-infectives    Start     Dose/Rate Route Frequency Ordered Stop   04/09/16 1800  ceFAZolin (ANCEF) IVPB 2 g/50 mL premix     2 g 100  mL/hr over 30 Minutes Intravenous Every 6 hours 04/09/16 1759 04/10/16 1759   04/09/16 1745  ceFAZolin (ANCEF) IVPB 2g/100  mL premix  Status:  Discontinued     2 g 200 mL/hr over 30 Minutes Intravenous Every 6 hours 04/09/16 1732 04/09/16 1759   04/09/16 0948  ceFAZolin (ANCEF) 2-4 GM/100ML-% IVPB    Comments:  Donna Jefferson: cabinet override      04/09/16 0948 04/09/16 2159   04/09/16 0600  ceFAZolin (ANCEF) IVPB 2g/100 mL premix  Status:  Discontinued     2 g 200 mL/hr over 30 Minutes Intravenous On call to O.R. 04/08/16 2219 04/09/16 0957    .  She was Fitted with AV 1 compression foot pump devices, instructed on heel pumps, early ambulation, and TED stockings bilaterally for DVT prophylaxis.  She benefited maximally from the hospital stay and there were no complications.    Recent vital signs:  Vitals:   04/10/16 0418 04/10/16 0728  BP: (!) 109/48 (!) 113/48  Pulse: 80 72  Resp: 16 16  Temp: 98.5 F (36.9 C) 97.7 F (36.5 C)    Recent laboratory studies:  Lab Results  Component Value Date   HGB 11.0 (L) 04/10/2016   HGB 13.3 03/26/2016   HGB 13.3 03/23/2015   Lab Results  Component Value Date   WBC 9.6 04/10/2016   PLT 215 04/10/2016   Lab Results  Component Value Date   INR 0.92 03/26/2016   Lab Results  Component Value Date   NA 133 (L) 04/10/2016   K 3.9 04/10/2016   CL 102 04/10/2016   CO2 25 04/10/2016   BUN 10 04/10/2016   CREATININE 0.54 04/10/2016   GLUCOSE 120 (H) 04/10/2016    Discharge Medications:   Allergies as of 04/11/2016      Reactions   Codeine Nausea Only   Sulfa Antibiotics Rash   Venlafaxine Hcl Rash   Vioxx [rofecoxib] Rash      Medication List    STOP taking these medications   Fish Oil 1200 MG Caps   naproxen sodium 220 MG tablet Commonly known as:  ANAPROX     TAKE these medications   ALIGN PO Take 1 capsule by mouth daily.   ALPRAZolam 0.25 MG tablet Commonly known as:  XANAX Take 0.25 mg by mouth at bedtime.   atenolol 25 MG tablet Commonly known as:  TENORMIN Take 25 mg by mouth at bedtime.   B-complex with vitamin C  tablet Take 1 tablet by mouth daily with lunch.   BIOTIN PO Take 1 tablet by mouth 2 (two) times daily.   CALTRATE 600+D PLUS PO Take 1 tablet by mouth 2 (two) times daily.   enoxaparin 40 MG/0.4ML injection Commonly known as:  LOVENOX Inject 0.4 mLs (40 mg total) into the skin daily.   GLUCOSAMINE CHONDR COMPLEX PO Take 1 tablet by mouth 2 (two) times daily.   GRAPE SEED COMPLEX PO Take 1 capsule by mouth daily with lunch.   TART CHERRY ADVANCED PO Take 1,200 mg by mouth daily.   HYDROcodone-acetaminophen 5-325 MG tablet Commonly known as:  NORCO Take 1-2 tablets by mouth every 4 (four) hours as needed for moderate pain. What changed:  how much to take   multivitamin with minerals Tabs tablet Take 1 tablet by mouth daily. Women's One-A-Day   omeprazole 20 MG capsule Commonly known as:  PRILOSEC Take 20 mg by mouth daily.   oxyCODONE 5 MG immediate release tablet Commonly known as:  Oxy  IR/ROXICODONE Take 1-2 tablets (5-10 mg total) by mouth every 4 (four) hours as needed for severe pain or breakthrough pain.   psyllium 95 % Pack Commonly known as:  HYDROCIL/METAMUCIL Take 1 packet by mouth at bedtime.   SUMAtriptan 100 MG tablet Commonly known as:  IMITREX Take 100 mg by mouth every 2 (two) hours as needed for migraine. May repeat in 2 hours if headache persists or recurs.   SYSTANE OP Place 1 drop into both eyes 2 (two) times daily.   traMADol 50 MG tablet Commonly known as:  ULTRAM Take 1-2 tablets (50-100 mg total) by mouth every 4 (four) hours as needed for moderate pain.            Durable Medical Equipment        Start     Ordered   04/09/16 1733  DME Walker rolling  Once    Question:  Patient needs a walker to treat with the following condition  Answer:  Total knee replacement status   04/09/16 1732   04/09/16 1733  DME Bedside commode  Once    Question:  Patient needs a bedside commode to treat with the following condition  Answer:  Total  knee replacement status   04/09/16 1732      Diagnostic Studies: Dg Knee Left Port  Result Date: 04/09/2016 CLINICAL DATA:  Postop knee replacement EXAM: PORTABLE LEFT KNEE - 1-2 VIEW COMPARISON:  MRI 02/21/2015 FINDINGS: changes of left knee replacement. Soft tissue drains in place. No hardware or bony complicating feature. IMPRESSION: Left knee replacement.  No complicating feature. Electronically Signed   By: Rolm Baptise M.D.   On: 04/09/2016 16:34    Disposition: 01-Home or Self Care    Follow-up Information    Feliberto Gottron, PA-C On 04/24/2016.   Specialties:  Orthopedic Surgery, Emergency Medicine Why:  at 2:15pm Contact information: [place] [address] [phone]        Dereck Leep, MD On 05/22/2016.   Specialty:  Orthopedic Surgery Why:  at 9:45am Contact information: [place] [address] [phone]            Signed: Watt Climes 04/10/2016, 7:56 AM

## 2016-04-10 NOTE — Progress Notes (Signed)
Physical Therapy Treatment Patient Details Name: Donna Jefferson MRN: [id number] DOB: September 05, 1940 Today's Date: 04/10/2016    History of Present Illness Pt is a 76 yo female s/p L TKR WBAT. PMH includes: R TKR, migraines, OA and skin cancer    PT Comments    Pt stated she was feeling tired but willing to participate in PT treatment. Pt displayed improved mobility this afternoon and no signs of nausea. Is able to perform bed mobility and transfers w/ RW and min guarding or supervision. She displayed limited L knee flexion during therex and ambulation secondary to increased pain. Pt able to ambulate w/ RW and supervision to nursing station and progress to a step through gait pattern with cuing; gait speed still slower then normals and patient displays increased pain during [place] on L LE. Overall patient is progressing but still limited in mobility due to decreased L LE ROM and strength as well as increased pain, she will continue to benefit from skilled PT, continue to recommend HHPT following hospital stay.    Follow Up Recommendations  Home health PT     Equipment Recommendations  Rolling walker with 5" wheels;3in1 (PT)    Recommendations for Other Services       Precautions / Restrictions Precautions Precautions: Fall;Knee Precaution Comments: did not need KI for OT evaluation Required Braces or Orthoses: Knee Immobilizer - Left Knee Immobilizer - Left: Discontinue once straight leg raise with < 10 degree lag Restrictions Weight Bearing Restrictions: Yes LLE Weight Bearing: Weight bearing as tolerated    Mobility  Bed Mobility Overal bed mobility: Needs Assistance Bed Mobility: Supine to Sit;Sit to Supine     Supine to sit: Min guard;HOB elevated Sit to supine: Min guard   General bed mobility comments: requires some guarding of the L LE but otherwise safe technique  Transfers Overall transfer level: Needs assistance Equipment used: Rolling walker (2  wheeled) Transfers: Sit to/from Stand Sit to Stand: Supervision         General transfer comment: hesistant to bear weight through L LE utilizes B UEs and R LE for transferring   Ambulation/Gait Ambulation/Gait assistance: Min guard Ambulation Distance (Feet): 60 Feet Assistive device: Rolling walker (2 wheeled) Gait Pattern/deviations: Step-through pattern;Decreased step length - left;Decreased step length - right;Antalgic   Gait velocity interpretation: Below normal speed for age/gender General Gait Details: increased pain during L stance phase, uses UE to offload L LE in standing, decreased L knee flexion compensates w/ posterior lurching    Stairs            Wheelchair Mobility    Modified Rankin (Stroke Patients Only)       Balance Overall balance assessment: Needs assistance Sitting-balance support: Feet supported;No upper extremity supported Sitting balance-Leahy Scale: Good Sitting balance - Comments: able to maintain sitting w/o back support   Standing balance support: Bilateral upper extremity supported Standing balance-Leahy Scale: Good Standing balance comment: requires use of RW in standing, L knee slightly flexed throughout                    Cognition Arousal/Alertness: Awake/alert Behavior During Therapy: WFL for tasks assessed/performed Overall Cognitive Status: Within Functional Limits for tasks assessed                      Exercises Total Joint Exercises Ankle Circles/Pumps: AROM;Both;10 reps;Supine;Strengthening Quad Sets: AROM;Strengthening;Right;10 reps;Supine Gluteal Sets: AROM;Strengthening;Both;10 reps;Supine Heel Slides: AAROM;Strengthening;Right;10 reps;Supine (min assist) Hip ABduction/ADduction: AROM;Strengthening;Right;10 reps;Supine Straight Leg Raises:  AROM;Strengthening;Right;10 reps;Supine (able to perform actively past 45 degrees, no need for knee immobilizer) Long Arc Quad: AROM;Strengthening;Right;10  reps;Seated Knee Flexion: AROM;Strengthening;Right;10 reps;Seated Goniometric ROM: 7-76 active assist ROM for L knee ext and flex Marching in Standing: AROM;Strengthening;Both;10 reps    General Comments        Pertinent Vitals/Pain Pain Assessment: 0-10 Pain Score: 4  Pain Location: L knee during mobility especially knee flexion Pain Descriptors / Indicators: Aching Pain Intervention(s): Monitored during session;Limited activity within patient's tolerance;Premedicated before session    Home Living Family/patient expects to be discharged to:: Private residence Living Arrangements: Spouse/significant other Available Help at Discharge: Family;Available 24 hours/day Type of Home: House Home Access: Stairs to enter Entrance Stairs-Rails: None Home Layout: One level Home Equipment: Shower seat - built in;Bedside commode;Hand held shower head;Grab bars - tub/shower;Grab bars - toilet;Adaptive equipment Additional Comments: newly renovated bathroom to support aging in place    Prior Function Level of Independence: Independent      Comments: pt independent in all ADLs and IADLs, is retired Education officer, 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, drives and cares for herself, [employer] 3x/wk with spouse, no falls in past 12 mo reported, active social life/support system   PT Goals (current goals can now be found in the care plan section) Acute Rehab PT Goals Patient Stated Goal: Return home  PT Goal Formulation: With patient Time For Goal Achievement: 04/24/16 Potential to Achieve Goals: Good Progress towards PT goals: Progressing toward goals    Frequency    BID      PT Plan Current plan remains appropriate    Co-evaluation             End of Session Equipment Utilized During Treatment: Gait belt Activity Tolerance: Patient tolerated treatment well Patient left: in bed;with call bell/phone within reach;with bed alarm set;with SCD's reapplied Nurse Communication: Mobility status PT  Visit Diagnosis: Unsteadiness on feet (R26.81);Muscle weakness (generalized) (M62.81)     Time: 9292-4462 PT Time Calculation (min) (ACUTE ONLY): 27 min  Charges:  $Therapeutic Exercise: 8-22 mins                    G Codes:       [employer] Student PT 04/10/2016, 1:54 PM

## 2016-04-10 NOTE — Progress Notes (Signed)
   Subjective: 1 Day Post-Op Procedure(s) (LRB): COMPUTER ASSISTED TOTAL KNEE ARTHROPLASTY (Left) Patient reports pain as moderate.   Patient is well, and has had no acute complaints or problems We will start therapy today.  Plan is to go Home after hospital stay. nausea and no vomiting Patient denies any chest pains or shortness of breath. Objective: Vital signs in last 24 hours: Temp:  [97.5 F (36.4 C)-98.5 F (36.9 C)] 97.7 F (36.5 C) (03/15 0728) Pulse Rate:  [60-81] 72 (03/15 0728) Resp:  [10-18] 16 (03/15 0728) BP: (109-159)/(42-73) 113/48 (03/15 0728) SpO2:  [95 %-100 %] 99 % (03/15 0728) Weight:  [63 kg (139 lb)-63.5 kg (140 lb)] 63 kg (139 lb) (03/14 1808) Heels are non tender and elevated off the bed using rolled towels as well as bone foam under the operative leg Intake/Output from previous day: 03/14 0701 - 03/15 0700 In: 3215 [I.V.:2545; IV Piggyback:670] Out: 1940 [Urine:1715; Drains:175; Blood:50] Intake/Output this shift: No intake/output data recorded.   Recent Labs  04/10/16 0419  HGB 11.0*    Recent Labs  04/10/16 0419  WBC 9.6  RBC 3.51*  HCT 32.1*  PLT 215    Recent Labs  04/10/16 0419  NA 133*  K 3.9  CL 102  CO2 25  BUN 10  CREATININE 0.54  GLUCOSE 120*  CALCIUM 8.3*   No results for input(s): LABPT, INR in the last 72 hours.  EXAM General - Patient is Alert, Appropriate and Oriented Extremity - Neurologically intact Neurovascular intact Sensation intact distally Intact pulses distally Dorsiflexion/Plantar flexion intact Compartment soft Dressing - dressing C/D/I Motor Function - intact, moving foot and toes well on exam.    Past Medical History:  Diagnosis Date  . Arthritis    osteoarthritis  . Cancer (HCC)    squamous cell skin cancer  . GERD (gastroesophageal reflux disease)   . Headache    migraines    Assessment/Plan: 1 Day Post-Op Procedure(s) (LRB): COMPUTER ASSISTED TOTAL KNEE ARTHROPLASTY  (Left) Active Problems:   S/P total knee arthroplasty  Estimated body mass index is 23.86 kg/m as calculated from the following:   Height as of this encounter: 5\' 4"  (1.626 m).   Weight as of this encounter: 63 kg (139 lb). Advance diet Up with therapy D/C IV fluids Plan for discharge tomorrow Discharge home with home health  Labs: Were reviewed DVT Prophylaxis - Lovenox, Foot Pumps and TED hose Weight-Bearing as tolerated to left leg D/C O2 and Pulse OX and try on Room Air Begin working on bowel movement. Labs tomorrow morning  Jillyn Ledger. [place] 04/10/2016, 7:44 AM

## 2016-04-10 NOTE — NC FL2 (Signed)
[place] LEVEL OF CARE SCREENING TOOL     IDENTIFICATION  Patient Name: Donna Jefferson Birthdate: [birth year] Sex: female Admission Date (Current Location): 04/09/2016  [place] and [place] Number:  Engineering geologist and Address:  [facility], [address] [place], [place] [address]      Provider Number: [id number]  Attending Physician Name and Address:  Dereck Leep, MD  Relative Name and Phone Number:       Current Level of Care: Hospital Recommended Level of Care: [place] Prior Approval Number:    Date Approved/Denied:   PASRR Number:  [id number])  Discharge Plan: SNF    Current Diagnoses: Patient Active Problem List   Diagnosis Date Noted  . S/P total knee arthroplasty 04/09/2016  . Primary osteoarthritis of left knee 02/24/2016  . Migraine 07/12/2013  . Sleep disturbance 07/12/2013    Orientation RESPIRATION BLADDER Height & Weight     Time, Self, Situation, Place  Normal Continent Weight: 139 lb (63 kg) Height:  5\' 4"  (162.6 cm)  BEHAVIORAL SYMPTOMS/MOOD NEUROLOGICAL BOWEL NUTRITION STATUS   (None. )  (None. ) Continent Diet (Diet: Regular)  AMBULATORY STATUS COMMUNICATION OF NEEDS Skin   Extensive Assist Verbally Surgical wounds (Incision: Left Knee)                       Personal Care Assistance Level of Assistance  Bathing, Feeding, Dressing Bathing Assistance: Limited assistance Feeding assistance: Independent Dressing Assistance: Limited assistance     Functional Limitations Info  Sight, Hearing, Speech Sight Info: Adequate Hearing Info: Adequate Speech Info: Adequate    SPECIAL CARE FACTORS FREQUENCY  PT (By licensed PT), OT (By licensed OT)     PT Frequency:  (5) OT Frequency:  (5)            Contractures      Additional Factors Info  Code Status, Allergies Code Status Info:  (Full Code) Allergies Info:  (Codeine, Sulfa Antibiotics, Venlafaxine Hcl, Vioxx  Rofecoxib)           Current Medications (04/10/2016):  This is the current hospital active medication list Current Facility-Administered Medications  Medication Dose Route Frequency Provider Last Rate Last Dose  . 0.9 %  sodium chloride infusion   Intravenous Continuous Dereck Leep, MD 100 mL/hr at 04/10/16 0522    . acetaminophen (OFIRMEV) IV 1,000 mg  1,000 mg Intravenous Q6H Dereck Leep, MD   1,000 mg at 04/10/16 0630  . acetaminophen (TYLENOL) tablet 650 mg  650 mg Oral Q6H PRN Dereck Leep, MD       Or  . acetaminophen (TYLENOL) suppository 650 mg  650 mg Rectal Q6H PRN Dereck Leep, MD      . acidophilus (RISAQUAD) capsule 1 capsule  1 capsule Oral Daily Dereck Leep, MD   1 capsule at 04/10/16 0908  . ALPRAZolam Duanne Moron) tablet 0.25 mg  0.25 mg Oral QHS Dereck Leep, MD   0.25 mg at 04/09/16 2117  . alum & mag hydroxide-simeth (MAALOX/MYLANTA) 200-200-20 MG/5ML suspension 30 mL  30 mL Oral Q4H PRN Dereck Leep, MD      . atenolol (TENORMIN) tablet 25 mg  25 mg Oral QHS Dereck Leep, MD   25 mg at 04/09/16 2117  . B-complex with vitamin C tablet 1 tablet  1 tablet Oral Q lunch Dereck Leep, MD   1 tablet at 04/10/16 0909  . bisacodyl (  DULCOLAX) suppository 10 mg  10 mg Rectal Daily PRN Dereck Leep, MD      . calcium-vitamin D (OSCAL WITH D) 500-200 MG-UNIT per tablet 1 tablet  1 tablet Oral BID Loree Fee, [facility]   1 tablet at 04/09/16 2117  . ceFAZolin (ANCEF) IVPB 2 g/50 mL premix  2 g Intravenous Q6H Dereck Leep, MD   2 g at 04/10/16 0522  . diphenhydrAMINE (BENADRYL) 12.5 MG/5ML elixir 12.5-25 mg  12.5-25 mg Oral Q4H PRN Dereck Leep, MD      . enoxaparin (LOVENOX) injection 30 mg  30 mg Subcutaneous Q12H Dereck Leep, MD   30 mg at 04/10/16 0908  . ferrous sulfate tablet 325 mg  325 mg Oral BID WC Dereck Leep, MD   325 mg at 04/10/16 0907  . magnesium hydroxide (MILK OF MAGNESIA) suspension 30 mL  30 mL Oral Daily PRN Dereck Leep, MD      .  menthol-cetylpyridinium (CEPACOL) lozenge 3 mg  1 lozenge Oral PRN Dereck Leep, MD       Or  . phenol (CHLORASEPTIC) mouth spray 1 spray  1 spray Mouth/Throat PRN Dereck Leep, MD      . metoCLOPramide (REGLAN) tablet 10 mg  10 mg Oral TID AC & HS Dereck Leep, MD   10 mg at 04/10/16 0908  . morphine 2 MG/ML injection 2 mg  2 mg Intravenous Q2H PRN Dereck Leep, MD   2 mg at 04/10/16 0446  . multivitamin with minerals tablet 1 tablet  1 tablet Oral Daily Dereck Leep, MD   1 tablet at 04/10/16 9480  . omega-3 acid ethyl esters (LOVAZA) capsule 1 g  1 g Oral BID Dereck Leep, MD   1 g at 04/10/16 0907  . ondansetron (ZOFRAN) tablet 4 mg  4 mg Oral Q6H PRN Dereck Leep, MD       Or  . ondansetron (ZOFRAN) injection 4 mg  4 mg Intravenous Q6H PRN Dereck Leep, MD   4 mg at 04/10/16 0150  . oxyCODONE (Oxy IR/ROXICODONE) immediate release tablet 5-10 mg  5-10 mg Oral Q4H PRN Dereck Leep, MD   10 mg at 04/10/16 0907  . pantoprazole (PROTONIX) EC tablet 40 mg  40 mg Oral BID AC Dereck Leep, MD   40 mg at 04/10/16 0908  . polyethylene glycol 0.4% and propylene glycol 0.3% (SYSTANE) ophthalmic gel   Both Eyes BID Dereck Leep, MD      . psyllium (HYDROCIL/METAMUCIL) packet 1 packet  1 packet Oral QHS Dereck Leep, MD   1 packet at 04/09/16 2117  . senna-docusate (Senokot-S) tablet 1 tablet  1 tablet Oral BID Dereck Leep, MD   1 tablet at 04/10/16 0908  . sodium phosphate (FLEET) 7-19 GM/118ML enema 1 enema  1 enema Rectal Once PRN Dereck Leep, MD      . SUMAtriptan (IMITREX) tablet 100 mg  100 mg Oral Q2H PRN Dereck Leep, MD      . traMADol Veatrice Bourbon) tablet 50-100 mg  50-100 mg Oral Q4H PRN Dereck Leep, MD         Discharge Medications: Please see discharge summary for a list of discharge medications.  Relevant Imaging Results:  Relevant Lab Results:   Additional Information  (SSN: [id number])  Danie Chandler, Student-Social Work

## 2016-04-10 NOTE — Care Management Note (Signed)
Case Management Note  Patient Details  Name: Donna Jefferson MRN: [id number] Date of Birth: 22-Aug-1940  Subjective/Objective:   Meet with patient to discuss discharge planning. Patient will return home with her husband. He is her caregiver. Discussed home health and she prefers [facility] since she has used them in the past. Ref[facility] for [place]. Patient will need a walker. Ordered from [place] with [facility]. She has a bsc and crutches. Pharmacy: [facility] [id number]. Called Lovenox 40 mg # 14 no refills. PCP is M. Baldemar Lenis.                   Action/Plan: [facility] for Home Health PT. Walker ordered.   Expected Discharge Date:  04/11/16               Expected Discharge Plan:  [place]  In-House Referral:     Discharge planning Services  CM Consult  Post Acute Care Choice:  Durable Medical Equipment, Home Health Choice offered to:  Patient  DME Arranged:  Walker rolling DME Agency:  [place]:  PT [place]:  [facility] (now [facility])  Status of Service:  In process, will continue to follow  If discussed at Long Length of Stay Meetings, dates discussed:    Additional Comments:  Jolly Mango, RN 04/10/2016, 2:20 PM

## 2016-04-10 NOTE — Discharge Instructions (Signed)

## 2016-04-10 NOTE — Progress Notes (Signed)
Occupational Therapy Treatment Patient Details Name: Donna Jefferson MRN: [id number] DOB: 1940/06/05 Today's Date: 04/10/2016    History of present illness Pt is a 76 yo female s/p L TKR WBAT. PMH includes: R TKR, migraines, OA and skin cancer   OT comments  Pt seen this afternoon for OT treatment session. Pt educated in use of ECS and home/routines modifications to maximize functional independence, quality of life, and minimize risk of falls. Pt able to verbalize understanding of all provided education. Progressing towards goals.   Follow Up Recommendations  No OT follow up    Equipment Recommendations  None recommended by OT    Recommendations for Other Services      Precautions / Restrictions Precautions Precautions: Fall;Knee Precaution Comments: no KI needed for session  Required Braces or Orthoses: Knee Immobilizer - Left Knee Immobilizer - Left: Discontinue once straight leg raise with < 10 degree lag Restrictions Weight Bearing Restrictions: Yes LLE Weight Bearing: Weight bearing as tolerated       Mobility Bed Mobility  Transfers    Balance                    ADL Overall ADL's : Needs assistance/impaired Eating/Feeding: Set up;Sitting   Grooming: Set up;Sitting   Upper Body Bathing: Sitting;Set up   Lower Body Bathing: Sit to/from stand;Sitting/lateral leans;Set up;Supervison/ safety   Upper Body Dressing : Set up;Sitting   Lower Body Dressing: Set up;Min guard;Sit to/from stand;Sitting/lateral leans;With adaptive equipment   Toilet Transfer: RW;Comfort height toilet;Min guard;Cueing for Office manager Details (indicate cue type and reason): cues for hand placement to maximize safety Toileting- Clothing Manipulation and Hygiene: Sitting/lateral lean;Independent       Functional mobility during ADLs: Min guard;Rolling walker General ADL Comments: generally supervision level for LB ADL, pt familiar with AE for LB ADL from previous  knee surgery      Vision                     Perception     Praxis Praxis Praxis tested?: Within functional limits    Cognition   Behavior During Therapy: [facility] for tasks assessed/performed Overall Cognitive Status: Within Functional Limits for tasks assessed                         Exercises Other Exercises Other Exercises: Pt educated in ECS, home/routines modifications with handout to maximize functional independence, quality of life, and minimize falls risk, pt verbalized understanding of all education provided   Shoulder Instructions       General Comments      Pertinent Vitals/ Pain       Pain Assessment: 0-10 Pain Score: 4  Pain Location: L knee during mobility especially knee flexion Pain Descriptors / Indicators: Aching Pain Intervention(s): Monitored during session;Limited activity within patient's tolerance;Premedicated before session  Home Living Family/patient expects to be discharged to:: Private residence Living Arrangements: Spouse/significant other Available Help at Discharge: Family;Available 24 hours/day Type of Home: House Home Access: Stairs to enter [employer] of Steps: 3 Entrance Stairs-Rails: None Home Layout: One level     Bathroom Shower/Tub: Occupational psychologist: Handicapped height Bathroom Accessibility: Yes How Accessible: Accessible via walker;Accessible via wheelchair Home Equipment: Shower seat - built in;Bedside commode;Hand held shower head;Grab bars - tub/shower;Grab bars - toilet;Adaptive equipment Adaptive Equipment: Reacher;Sock aid Additional Comments: newly renovated bathroom to support aging in place      Prior Functioning/Environment  Level of Independence: Independent        Comments: pt independent in all ADLs and IADLs, is retired Education officer, 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, drives and cares for herself, [employer] 3x/wk with spouse, no falls in past 12 mo reported, active social  life/support system   Frequency  Min 1X/week        Progress Toward Goals  OT Goals(current goals can now be found in the care plan section)  Progress towards OT goals: Progressing toward goals  Acute Rehab OT Goals Patient Stated Goal: Return home  OT Goal Formulation: With patient Time For Goal Achievement: 04/17/16 Potential to Achieve Goals: Good ADL Goals Pt Will Perform Lower Body Dressing: with set-up;with adaptive equipment;sitting/lateral leans;sit to/from stand;with supervision Additional ADL Goal #1: Pt will verbalize understanding of learned ECS to support falls prevention and functional independence in the home  Plan Discharge plan remains appropriate;Frequency remains appropriate    Co-evaluation                 End of Session Equipment Utilized During Treatment: Gait belt;Rolling walker  OT Visit Diagnosis: Other abnormalities of gait and mobility (R26.89);Muscle weakness (generalized) (M62.81);Pain Pain - Right/Left: Left Pain - part of body: Knee   Activity Tolerance Patient tolerated treatment well   Patient Left in bed;with call bell/phone within reach;with bed alarm set;with SCD's reapplied (polar care in place)   Nurse Communication      Functional Limitation: Self care Self Care Current Status (T9774): At least 20 percent but less than 40 percent impaired, limited or restricted Self Care Goal Status (F4239): At least 1 percent but less than 20 percent impaired, limited or restricted   Time: 5320-2334 OT Time Calculation (min): 10 min  Charges: OT G-codes **NOT FOR INPATIENT CLASS** Functional Limitation: Self care Self Care Current Status (D5686): At least 20 percent but less than 40 percent impaired, limited or restricted Self Care Goal Status (H6837): At least 1 percent but less than 20 percent impaired, limited or restricted OT General Charges $OT Visit: 1 Procedure OT Evaluation $OT Eval Low Complexity: 1 Procedure OT  Treatments $Self Care/Home Management : 8-22 mins  Jeni Salles, MPH, MS, OTR/L ascom [phone] 04/10/16, 3:43 PM

## 2016-04-10 NOTE — Anesthesia Postprocedure Evaluation (Signed)
Anesthesia Post Note  Patient: Donna Jefferson  Procedure(s) Performed: Procedure(s) (LRB): COMPUTER ASSISTED TOTAL KNEE ARTHROPLASTY (Left)  Patient location during evaluation: Nursing Unit Anesthesia Type: Spinal Level of consciousness: awake, awake and alert and oriented Pain management: pain level controlled Vital Signs Assessment: post-procedure vital signs reviewed and stable Respiratory status: spontaneous breathing and nonlabored ventilation Cardiovascular status: blood pressure returned to baseline and stable Postop Assessment: no headache and no backache Anesthetic complications: no     Last Vitals:  Vitals:   04/10/16 0418 04/10/16 0728  BP: (!) 109/48 (!) 113/48  Pulse: 80 72  Resp: 16 16  Temp: 36.9 C 36.5 C    Last Pain:  Vitals:   04/10/16 0728  TempSrc: Oral  PainSc:                  Johnna Acosta

## 2016-04-10 NOTE — Evaluation (Signed)
Physical Therapy Evaluation Patient Details Name: Donna Jefferson MRN: [id number] DOB: [birth year] Today's Date: 04/10/2016   History of Present Illness  Pt is a 76 yo female s/p L TKR WBAT. PMH includes: R TKR, migraines, OA and skin cancer  Clinical Impression  Pt willing to participate in PT evaluation and demonstrated good safety awareness throughout. Reported pain as 5/10 and received pain medications prior to session. She displays decreased strength and ROM of the L knee due to recent surgery above grossly 4/5 and able to perform active SLR beyond 45 degrees (no need for knee immobilizer). Pt requires assistance for bed mobility and min guarding for transfers due to decreased R LE strength and increased pain. Pt also requires use of RW in standing due to unsteadiness on her feet. She was able to ambulated to her room door and back to her chair with a RW and guarding for safety; she was limited in ambulation due to nausea and pain but was able to safely transfer to her chair. Overall patient is limited in mobility due to decreased strength and ROM of the RLE as well as increased pain, she will benefit from skilled PT to return to her baseline activities, recommend she receive HHPT following acute hospital stay.     Follow Up Recommendations Home health PT    Equipment Recommendations  Rolling walker with 5" wheels;3in1 (PT)    Recommendations for Other Services       Precautions / Restrictions Precautions Precautions: Fall;Knee Restrictions Weight Bearing Restrictions: Yes LLE Weight Bearing: Weight bearing as tolerated      Mobility  Bed Mobility Overal bed mobility: Needs Assistance Bed Mobility: Supine to Sit     Supine to sit: Min assist;HOB elevated     General bed mobility comments: requires min assist to move L LE   Transfers Overall transfer level: Needs assistance Equipment used: Rolling walker (2 wheeled) Transfers: Sit to/from Stand Sit to Stand: Min guard          General transfer comment: cuing for hand placement and safe use of RW, is able to transfer to standing using b UE, no buckling noted in L LE  Ambulation/Gait Ambulation/Gait assistance: Min guard Ambulation Distance (Feet): 16 Feet Assistive device: Rolling walker (2 wheeled) Gait Pattern/deviations: Step-to pattern;Decreased step length - right;Decreased stance time - left;Antalgic   Gait velocity interpretation: Below normal speed for age/gender General Gait Details: pt ambualted to door and back to chair, decreased gait speed and heavy use b UE's to offload L LE in L stance, limited in ambulation secondary to nausea and pain, no signs of bukcling or LOB during ambulation  Stairs            Wheelchair Mobility    Modified Rankin (Stroke Patients Only)       Balance Overall balance assessment: Needs assistance Sitting-balance support: Feet supported;No upper extremity supported Sitting balance-Leahy Scale: Good Sitting balance - Comments: able to maintain sitting w/o back support   Standing balance support: Bilateral upper extremity supported Standing balance-Leahy Scale: Fair Standing balance comment: requires use of RW in standing, L knee slightly flexed throughout                             Pertinent Vitals/Pain Pain Assessment: 0-10 Pain Score: 4  Pain Location: L knee increased with activity especially knee flexion Pain Descriptors / Indicators: Aching Pain Intervention(s): Limited activity within patient's tolerance;Monitored during session;Premedicated before session  Home Living Family/patient expects to be discharged to:: Private residence Living Arrangements: Spouse/significant other Available Help at Discharge: Family;Available 24 hours/day (Husband ) Type of Home: House Home Access: Stairs to enter Entrance Stairs-Rails: None Entrance Stairs-Number of Steps: 3 Home Layout: One level Home Equipment: Other (comment) (forearm  crutches)      Prior Function Level of Independence: Independent         Comments: pt independent in all ADLs and IADLs, is retired Education officer, 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, drives and cares for herself     Hand Dominance        Extremity/Trunk Assessment   Upper Extremity Assessment Upper Extremity Assessment: Overall WFL for tasks assessed    Lower Extremity Assessment Lower Extremity Assessment: LLE deficits/detail LLE Deficits / Details: grossly 4/5 for hip and knee movements, 5/5 DF and PF, increased pain and limited ROM        Communication   Communication: No difficulties  Cognition Arousal/Alertness: Awake/alert Behavior During Therapy: WFL for tasks assessed/performed Overall Cognitive Status: Within Functional Limits for tasks assessed                      General Comments      Exercises Total Joint Exercises Ankle Circles/Pumps: AROM;Both;10 reps;Supine;Strengthening Quad Sets: AROM;Strengthening;Right;10 reps;Supine Gluteal Sets: AROM;Strengthening;Both;10 reps;Supine Heel Slides: AAROM;Strengthening;Right;10 reps;Supine (min assist) Hip ABduction/ADduction: AROM;Strengthening;Right;10 reps;Supine Straight Leg Raises: AROM;Strengthening;Right;10 reps;Supine (able to perform actively past 45 degrees, no need for knee immobilizer) Goniometric ROM: 7-76 active assist ROM for L knee ext and flex   Assessment/Plan    PT Assessment Patient needs continued PT services  PT Problem List Decreased strength;Decreased range of motion;Decreased activity tolerance;Decreased balance;Decreased mobility;Decreased knowledge of use of DME       PT Treatment Interventions DME instruction;Gait training;Stair training;Functional mobility training;Balance training;Therapeutic exercise;Therapeutic activities;Patient/family education    PT Goals (Current goals can be found in the Care Plan section)  Acute Rehab PT Goals Patient Stated Goal: Return home  PT Goal Formulation:  With patient Time For Goal Achievement: 04/24/16 Potential to Achieve Goals: Good    Frequency BID   Barriers to discharge        Co-evaluation               End of Session Equipment Utilized During Treatment: Gait belt Activity Tolerance: Patient limited by pain;Other (comment) (nausea) Patient left: in chair;with call bell/phone within reach;with chair alarm set;with SCD's reapplied;Other (comment) (polar care applied) Nurse Communication: Mobility status PT Visit Diagnosis: Unsteadiness on feet (R26.81);Muscle weakness (generalized) (M62.81)         Time: 6767-2094 PT Time Calculation (min) (ACUTE ONLY): 30 min   Charges:         PT G Codes:         Kelda Azad Student PT 04/10/2016, 11:34 AM

## 2016-04-10 NOTE — Progress Notes (Signed)
Clinical Social Worker (CSW) received SNF consult. PT is recommending home health. RN case manager aware of above. Please reconsult if future social work needs arise. CSW signing off.   Yuvan Medinger, LCSW [phone] 

## 2016-04-11 LAB — CBC
HCT: 29.4 % — ABNORMAL LOW (ref 35.0–47.0)
HEMOGLOBIN: 10.1 g/dL — AB (ref 12.0–16.0)
MCH: 31.1 pg (ref 26.0–34.0)
MCHC: 34.5 g/dL (ref 32.0–36.0)
MCV: 90 fL (ref 80.0–100.0)
PLATELETS: 221 10*3/uL (ref 150–440)
RBC: 3.26 MIL/uL — AB (ref 3.80–5.20)
RDW: 13.2 % (ref 11.5–14.5)
WBC: 12.3 10*3/uL — ABNORMAL HIGH (ref 3.6–11.0)

## 2016-04-11 LAB — BASIC METABOLIC PANEL
Anion gap: 4 — ABNORMAL LOW (ref 5–15)
BUN: 8 mg/dL (ref 6–20)
CHLORIDE: 96 mmol/L — AB (ref 101–111)
CO2: 28 mmol/L (ref 22–32)
CREATININE: 0.32 mg/dL — AB (ref 0.44–1.00)
Calcium: 8.4 mg/dL — ABNORMAL LOW (ref 8.9–10.3)
Glucose, Bld: 129 mg/dL — ABNORMAL HIGH (ref 65–99)
POTASSIUM: 3.8 mmol/L (ref 3.5–5.1)
SODIUM: 128 mmol/L — AB (ref 135–145)

## 2016-04-11 MED ORDER — ENOXAPARIN SODIUM 40 MG/0.4ML ~~LOC~~ SOLN: 40.0000 mg | SUBCUTANEOUS | 0 refills | Status: DC

## 2016-04-11 MED ORDER — LACTULOSE 10 GM/15ML PO SOLN: 10.0000 g | Freq: Two times a day (BID) | ORAL | Status: DC | PRN

## 2016-04-11 MED ORDER — OXYCODONE HCL 5 MG PO TABS: 5.0000 mg | ORAL_TABLET | ORAL | 0 refills | Status: DC | PRN

## 2016-04-11 MED ORDER — TRAMADOL HCL 50 MG PO TABS: 50.0000 mg | ORAL_TABLET | ORAL | 0 refills | Status: AC | PRN

## 2016-04-11 NOTE — Progress Notes (Signed)
Discharge summary reviewed with patient and spouse with verbal understanding. Answered all questions. Belongings, Lovenox kit, and 2 Narcotic Rx given upon discharge. VSS at this time. Prior to dc , spoke with PA regarding Na level, no new orders proceed with discharge. Education reinforced to continue bowel regime. Escorted to personal vehicle by wc via ortho staff.

## 2016-04-11 NOTE — Progress Notes (Signed)
Physical Therapy Treatment Patient Details Name: Donna Jefferson MRN: [id number] DOB: [birth year] Today's Date: 04/11/2016    History of Present Illness Pt is a 76 yo female s/p L TKR WBAT. PMH includes: R TKR, migraines, OA and skin cancer    PT Comments    Pt reported pain as moderate 5/10. Educated patient on ascending and descending steps w/ and w/o a railing; patient able to safely ascend and descend 4 steps with a railing and min guarding to mimic entering her front door; then had patient ascend and descend 3 steps using a RW and min assist to mimic alternative entrance, recommended using railing due to increased safety and patient agrees.   Pt also demonstrated improved activity tolerance in ambulation and able to ambulate around nursing station w/ RW under PT superivison. Pt has now met all acute care PT goals and is safe for household mobility, she will continue to benefit from skilled PT to further improve L LE strength and ROM for functional tasks. Recommend HHPT following d/c from hospital.    Follow Up Recommendations  Home health PT     Equipment Recommendations  Rolling walker with 5" wheels;3in1 (PT)    Recommendations for Other Services       Precautions / Restrictions Precautions Precautions: Fall;Knee Restrictions Weight Bearing Restrictions: Yes LLE Weight Bearing: Weight bearing as tolerated    Mobility  Bed Mobility Overal bed mobility: Needs Assistance Bed Mobility: Supine to Sit;Sit to Supine     Supine to sit: Supervision     General bed mobility comments: pt able to safely move from supine to sitting and sitting to supine w/ increased time and minimal cuing   Transfers Overall transfer level: Needs assistance Equipment used: Rolling walker (2 wheeled) Transfers: Sit to/from Stand Sit to Stand: Supervision         General transfer comment: able to advance to standing w/ B UE use for lift off, demonstrated improved L LE position and more  symmetrical WBing   Ambulation/Gait Ambulation/Gait assistance: Supervision Ambulation Distance (Feet): 200 Feet Assistive device: Rolling walker (2 wheeled) Gait Pattern/deviations: Step-through pattern;Antalgic Gait velocity: improving, safe for household mobility   General Gait Details: able to progress to more symmetrical step through pattern, demonstrates good safety awareness and no buckling or instability of R knee   Stairs Stairs: Yes   Stair Management: One rail Right;No rails Number of Stairs: 7 General stair comments: Performed stair ambulation w/ Railing and w/o railing, educated on safe technique and required min guarding for safety, husband was present , utilized step to pattern  Wheelchair Mobility    Modified Rankin (Stroke Patients Only)       Balance Overall balance assessment: Needs assistance Sitting-balance support: Feet supported;No upper extremity supported Sitting balance-Leahy Scale: Normal Sitting balance - Comments: able to maintain sitting w/o back support   Standing balance support: Bilateral upper extremity supported;During functional activity Standing balance-Leahy Scale: Good Standing balance comment: requires use of RW in standing, improved WBing on the L knee                     Cognition Arousal/Alertness: Awake/alert Behavior During Therapy: WFL for tasks assessed/performed Overall Cognitive Status: Within Functional Limits for tasks assessed                      Exercises Total Joint Exercises Goniometric ROM: 5-83 active assist ROM for L knee ext and flex    General Comments  Pertinent Vitals/Pain Pain Assessment: 0-10 Pain Score: 5  Pain Location: L knee during mobility especially knee flexion Pain Descriptors / Indicators: Aching Pain Intervention(s): Limited activity within patient's tolerance;Monitored during session;Premedicated before session    Home Living Family/patient expects to be  discharged to:: Private residence Living Arrangements: Spouse/significant other Available Help at Discharge: Family;Available 24 hours/day Type of Home: House Home Access: Stairs to enter Entrance Stairs-Rails: None (has alternate entrance w/ 6 steps and railings) Home Layout: One level Home Equipment: Shower seat - built in;Bedside commode;Hand held shower head;Grab bars - tub/shower;Grab bars - toilet;Adaptive equipment Additional Comments: newly renovated bathroom to support aging in place    Prior Function Level of Independence: Independent      Comments: pt independent in all ADLs and IADLs, is retired Education officer, 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, drives and cares for herself, [employer] 3x/wk with spouse, no falls in past 12 mo reported, active social life/support system   PT Goals (current goals can now be found in the care plan section) Acute Rehab PT Goals Patient Stated Goal: Return home  PT Goal Formulation: With patient Time For Goal Achievement: 04/24/16 Potential to Achieve Goals: Good Progress towards PT goals: Progressing toward goals    Frequency    BID      PT Plan Current plan remains appropriate    Co-evaluation             End of Session Equipment Utilized During Treatment: Gait belt Activity Tolerance: Patient tolerated treatment well Patient left: in bed;with bed alarm set;with call bell/phone within reach;with family/visitor present;Other (comment) (w/ bone foam) Nurse Communication: Mobility status PT Visit Diagnosis: Unsteadiness on feet (R26.81);Muscle weakness (generalized) (M62.81)     Time: 1552-0802 PT Time Calculation (min) (ACUTE ONLY): 23 min  Charges:                       G Codes:       [employer] Student PT 04/11/2016, 11:11 AM

## 2016-04-11 NOTE — Progress Notes (Signed)
   Subjective: 2 Days Post-Op Procedure(s) (LRB): COMPUTER ASSISTED TOTAL KNEE ARTHROPLASTY (Left) Patient reports pain as mild.   Patient is well, and has had no acute complaints or problems Continue with physical therapy today.  Plan is to go Home after hospital stay. no nausea and no vomiting Patient denies any chest pains or shortness of breath. Objective: Vital signs in last 24 hours: Temp:  [97.7 F (36.5 C)-98.6 F (37 C)] 98.6 F (37 C) (03/16 0356) Pulse Rate:  [72-81] 74 (03/16 0356) Resp:  [15-18] 16 (03/16 0356) BP: (112-142)/(40-60) 112/40 (03/16 0356) SpO2:  [94 %-99 %] 94 % (03/16 0356) well approximated incision Heels are non tender and elevated off the bed using rolled towels Intake/Output from previous day: 03/15 0701 - 03/16 0700 In: 600 [P.O.:600] Out: 464 [Urine:254; Drains:210] Intake/Output this shift: No intake/output data recorded.   Recent Labs  04/10/16 0419 04/11/16 0307  HGB 11.0* 10.1*    Recent Labs  04/10/16 0419 04/11/16 0307  WBC 9.6 12.3*  RBC 3.51* 3.26*  HCT 32.1* 29.4*  PLT 215 221    Recent Labs  04/10/16 0419 04/11/16 0307  NA 133* 128*  K 3.9 3.8  CL 102 96*  CO2 25 28  BUN 10 8  CREATININE 0.54 0.32*  GLUCOSE 120* 129*  CALCIUM 8.3* 8.4*   No results for input(s): LABPT, INR in the last 72 hours.  EXAM General - Patient is Alert, Appropriate and Oriented Extremity - Neurologically intact Neurovascular intact Sensation intact distally Intact pulses distally Dorsiflexion/Plantar flexion intact No cellulitis present Compartment soft Dressing - dressing C/D/I Motor Function - intact, moving foot and toes well on exam.    Past Medical History:  Diagnosis Date  . Arthritis    osteoarthritis  . Cancer (HCC)    squamous cell skin cancer  . GERD (gastroesophageal reflux disease)   . Headache    migraines    Assessment/Plan: 2 Days Post-Op Procedure(s) (LRB): COMPUTER ASSISTED TOTAL KNEE  ARTHROPLASTY (Left) Active Problems:   S/P total knee arthroplasty  Estimated body mass index is 23.86 kg/m as calculated from the following:   Height as of this encounter: 5\' 4"  (1.626 m).   Weight as of this encounter: 63 kg (139 lb). Up with therapy Discharge home with home health after patient does lap around the nurse's desk, does steps and have a bowel movement.  Labs: Were reviewed DVT Prophylaxis - Lovenox, Foot Pumps and TED hose Weight-Bearing as tolerated to left leg Hemovac was discontinued on today's visit Please wash the left leg and apply TED stockings bilaterally. Please change dressing to operative leg prior to discharge Be sure that the bone foam goes home with patient  We will add lactulose  Donna Jefferson Donna Jefferson 04/11/2016, 7:13 AM

## 2016-04-11 NOTE — Care Management Note (Signed)
Case Management Note  Patient Details  Name: Donna Jefferson MRN: [id number] Date of Birth: 08/15/1940  Subjective/Objective:  [facility] notified of discharge. Cost of Lovenox is $ 64.00. Patient has already picked up prescription. Donna Jefferson has been delivered by [facility].                  Action/Plan: Case closed  Expected Discharge Date:  04/11/16               Expected Discharge Plan:  [place]  In-House Referral:     Discharge planning Services  CM Consult  Post Acute Care Choice:  Durable Medical Equipment, Home Health Choice offered to:  Patient  DME Arranged:  Walker rolling DME Agency:  [place]:  PT [place]:  [place] (now [facility])  Status of Service:  Completed, signed off  If discussed at [employer] of Stay Meetings, dates discussed:    Additional Comments:  Jolly Mango, RN 04/11/2016, 9:01 AM

## 2016-05-14 ENCOUNTER — Ambulatory Visit
Admission: RE | Admit: 2016-05-14 | Discharge: 2016-05-14 | Disposition: A | Discharge status: Home / Self Care | Payer: Medicare Other | Source: Ambulatory Visit | Attending: Family Medicine | Admitting: Family Medicine

## 2016-05-14 DIAGNOSIS — Z1231 Encounter for screening mammogram for malignant neoplasm of breast: Secondary | ICD-10-CM | POA: Diagnosis present

## 2017-04-01 ENCOUNTER — Other Ambulatory Visit: Payer: Self-pay | Admitting: Family Medicine

## 2017-04-01 DIAGNOSIS — Z1231 Encounter for screening mammogram for malignant neoplasm of breast: Secondary | ICD-10-CM

## 2017-05-27 ENCOUNTER — Ambulatory Visit
Admission: RE | Admit: 2017-05-27 | Discharge: 2017-05-27 | Disposition: A | Discharge status: Home / Self Care | Payer: Medicare Other | Source: Ambulatory Visit | Attending: Family Medicine | Admitting: Family Medicine

## 2017-05-27 DIAGNOSIS — Z1231 Encounter for screening mammogram for malignant neoplasm of breast: Secondary | ICD-10-CM | POA: Diagnosis present

## 2017-12-28 ENCOUNTER — Other Ambulatory Visit: Payer: Self-pay | Admitting: Unknown Physician Specialty

## 2017-12-28 DIAGNOSIS — K1123 Chronic sialoadenitis: Secondary | ICD-10-CM

## 2017-12-30 ENCOUNTER — Other Ambulatory Visit: Payer: Self-pay | Admitting: Unknown Physician Specialty

## 2017-12-30 DIAGNOSIS — R220 Localized swelling, mass and lump, head: Secondary | ICD-10-CM

## 2017-12-30 DIAGNOSIS — K1123 Chronic sialoadenitis: Secondary | ICD-10-CM

## 2017-12-31 ENCOUNTER — Ambulatory Visit
Admission: RE | Admit: 2017-12-31 | Discharge: 2017-12-31 | Disposition: A | Discharge status: Home / Self Care | Payer: Medicare Other | Source: Ambulatory Visit | Attending: Unknown Physician Specialty | Admitting: Unknown Physician Specialty

## 2017-12-31 DIAGNOSIS — R220 Localized swelling, mass and lump, head: Secondary | ICD-10-CM | POA: Diagnosis not present

## 2017-12-31 DIAGNOSIS — K1123 Chronic sialoadenitis: Secondary | ICD-10-CM | POA: Diagnosis not present

## 2018-01-04 ENCOUNTER — Other Ambulatory Visit: Payer: Self-pay | Admitting: Unknown Physician Specialty

## 2018-01-04 DIAGNOSIS — R221 Localized swelling, mass and lump, neck: Secondary | ICD-10-CM

## 2018-01-12 ENCOUNTER — Ambulatory Visit
Admission: RE | Admit: 2018-01-12 | Discharge: 2018-01-12 | Disposition: A | Discharge status: Home / Self Care | Payer: Medicare Other | Source: Ambulatory Visit | Attending: Unknown Physician Specialty | Admitting: Unknown Physician Specialty

## 2018-01-12 DIAGNOSIS — R221 Localized swelling, mass and lump, neck: Secondary | ICD-10-CM | POA: Insufficient documentation

## 2018-01-12 HISTORY — DX: Squamous cell carcinoma of skin, unspecified: C44.92

## 2018-01-12 LAB — POCT I-STAT CREATININE: CREATININE: 0.8 mg/dL (ref 0.44–1.00)

## 2018-01-12 MED ORDER — IOPAMIDOL (ISOVUE-300) INJECTION 61%
75.0000 mL | Freq: Once | INTRAVENOUS | Status: AC | PRN
  Administered 2018-01-12: 75 mL via INTRAVENOUS

## 2018-01-14 ENCOUNTER — Other Ambulatory Visit: Payer: Self-pay | Admitting: Unknown Physician Specialty

## 2018-01-14 DIAGNOSIS — R221 Localized swelling, mass and lump, neck: Secondary | ICD-10-CM

## 2018-01-14 DIAGNOSIS — K1123 Chronic sialoadenitis: Secondary | ICD-10-CM

## 2018-01-17 ENCOUNTER — Encounter: Payer: Self-pay | Admitting: Emergency Medicine

## 2018-01-17 ENCOUNTER — Other Ambulatory Visit: Payer: Self-pay

## 2018-01-17 ENCOUNTER — Ambulatory Visit
Admission: EM | Admit: 2018-01-17 | Discharge: 2018-01-17 | Disposition: A | Discharge status: Home / Self Care | Payer: Medicare Other | Attending: Emergency Medicine | Admitting: Emergency Medicine

## 2018-01-17 DIAGNOSIS — B9689 Other specified bacterial agents as the cause of diseases classified elsewhere: Secondary | ICD-10-CM | POA: Diagnosis not present

## 2018-01-17 DIAGNOSIS — N3001 Acute cystitis with hematuria: Secondary | ICD-10-CM | POA: Diagnosis not present

## 2018-01-17 LAB — URINALYSIS, COMPLETE (UACMP) WITH MICROSCOPIC
Bilirubin Urine: NEGATIVE
GLUCOSE, UA: NEGATIVE mg/dL
Ketones, ur: NEGATIVE mg/dL
Nitrite: NEGATIVE
PROTEIN: NEGATIVE mg/dL
SQUAMOUS EPITHELIAL / LPF: NONE SEEN (ref 0–5)
Specific Gravity, Urine: 1.01 (ref 1.005–1.030)
pH: 6.5 (ref 5.0–8.0)

## 2018-01-17 MED ORDER — PHENAZOPYRIDINE HCL 200 MG PO TABS: 200.0000 mg | ORAL_TABLET | Freq: Three times a day (TID) | ORAL | 0 refills | Status: DC | PRN

## 2018-01-17 MED ORDER — CEPHALEXIN 500 MG PO CAPS: 500.0000 mg | ORAL_CAPSULE | Freq: Two times a day (BID) | ORAL | 0 refills | Status: DC

## 2018-01-17 NOTE — Discharge Instructions (Addendum)
Continue drinking plenty of extra fluids.  We will call you only if we need to change your antibiotics.  The Pyridium will help with your symptoms.  It will turn your urine orange.  You do not need Azo if you are taking the Pyridium.

## 2018-01-17 NOTE — ED Triage Notes (Signed)
Patient c/o burning when urinating that started this morning.   

## 2018-01-17 NOTE — ED Provider Notes (Signed)
HPI  SUBJECTIVE:  Donna Jefferson is a 77 y.o. adult who presents with burning dysuria, urgency, frequency, odorous urine starting last night.  She denies cloudy urine, hematuria.  No nausea, vomiting, fevers, abdominal, back, pelvic pain.  No vaginal bleeding, odor, discharge, rash.  No antibiotics in the past month.  No perfumed soaps or body washes.  She tried pushing fluids with improvement in her symptoms.  Symptoms are worse with urinating.  She is sexually active with her husband who is asymptomatic.  STDs are not a concern today.  She has a past medical history of UTIs.  No history of pyelonephritis, nephrolithiasis.  No history of gonorrhea, chlamydia, herpes, HIV, syphilis, Trichomonas, BV, yeast, diabetes, hypertension.  WUJ:WJXBJYN, Beverely Low, MD   Past Medical History:  Diagnosis Date  . Arthritis    osteoarthritis  . GERD (gastroesophageal reflux disease)   . Headache    migraines  . Squamous cell skin cancer     Past Surgical History:  Procedure Laterality Date  . ABDOMINAL HYSTERECTOMY    . APPENDECTOMY    . AUGMENTATION MAMMAPLASTY Bilateral 1980's  . CATARACT EXTRACTION, BILATERAL    . CHOLECYSTECTOMY    . COLONOSCOPY    . EYE SURGERY Bilateral    Cataract Extraction with IOL  . JOINT REPLACEMENT Right    knee  . KNEE ARTHROPLASTY Left 04/09/2016   Procedure: COMPUTER ASSISTED TOTAL KNEE ARTHROPLASTY;  Surgeon: Dereck Leep, MD;  Location: [facility] OR[facility];  Service: Orthopedics;  Laterality: Left;  . KNEE ARTHROSCOPY Left 04/02/2015   Procedure: ARTHROSCOPY KNEE. PARTIAL MEDIAL MENISECTOMY, MEDIAL CHONDROPLASTY;  Surgeon: Dereck Leep, MD;  Location: [facility];  Service: Orthopedics;  Laterality: Left;  . TONSILLECTOMY    . TRIGGER FINGER RELEASE Right    thumb    Family History  Problem Relation Age of Onset  . Breast cancer Neg Hx     Social History   Tobacco Use  . Smoking status: Never Smoker  . Smokeless tobacco: Never Used  Substance Use Topics  .  Alcohol use: Yes    Comment: wine occ  . Drug use: No    No current facility-administered medications for this encounter.   Current Outpatient Medications:  .  ALPRAZolam (XANAX) 0.25 MG tablet, Take 0.25 mg by mouth at bedtime. , Disp: , Rfl:  .  atenolol (TENORMIN) 25 MG tablet, Take 25 mg by mouth at bedtime., Disp: , Rfl:  .  BIOTIN PO, Take 1 tablet by mouth 2 (two) times daily., Disp: , Rfl:  .  Calcium Carbonate-Vit D-Min (CALTRATE 600+D PLUS PO), Take 1 tablet by mouth 2 (two) times daily., Disp: , Rfl:  .  Glucosamine-Chondroitin (GLUCOSAMINE CHONDR COMPLEX PO), Take 1 tablet by mouth 2 (two) times daily. , Disp: , Rfl:  .  Multiple Vitamin (MULTIVITAMIN WITH MINERALS) TABS tablet, Take 1 tablet by mouth daily. Women's One-A-Day, Disp: , Rfl:  .  omeprazole (PRILOSEC) 20 MG capsule, Take 20 mg by mouth daily., Disp: , Rfl:  .  Probiotic Product (ALIGN PO), Take 1 capsule by mouth daily. , Disp: , Rfl:  .  SUMAtriptan (IMITREX) 100 MG tablet, Take 100 mg by mouth every 2 (two) hours as needed for migraine. May repeat in 2 hours if headache persists or recurs., Disp: , Rfl:  .  cephALEXin (KEFLEX) 500 MG capsule, Take 1 capsule (500 mg total) by mouth 2 (two) times daily., Disp: 14 capsule, Rfl: 0 .  Misc Natural Products (GRAPE SEED COMPLEX PO),  Take 1 capsule by mouth daily with lunch. , Disp: , Rfl:  .  Misc Natural Products (TART CHERRY ADVANCED PO), Take 1,200 mg by mouth daily., Disp: , Rfl:  .  phenazopyridine (PYRIDIUM) 200 MG tablet, Take 1 tablet (200 mg total) by mouth 3 (three) times daily as needed for pain., Disp: 6 tablet, Rfl: 0 .  Polyethyl Glycol-Propyl Glycol (SYSTANE OP), Place 1 drop into both eyes 2 (two) times daily. , Disp: , Rfl:  .  psyllium (HYDROCIL/METAMUCIL) 95 % PACK, Take 1 packet by mouth at bedtime., Disp: , Rfl:  .  traMADol (ULTRAM) 50 MG tablet, Take 1-2 tablets (50-100 mg total) by mouth every 4 (four) hours as needed for moderate pain., Disp: 60  tablet, Rfl: 0  Allergies  Allergen Reactions  . Codeine Nausea Only  . Sulfa Antibiotics Rash  . Venlafaxine Hcl Rash  . Vioxx [Rofecoxib] Rash     ROS  As noted in HPI.   Physical Exam  BP (!) 148/71 (BP Location: Left Arm)   Pulse 68   Temp 97.8 F (36.6 C) (Oral)   Resp 16   Ht 5\' 4"  (1.626 m)   Wt 65.8 kg   SpO2 99%   BMI 24.89 kg/m   Constitutional: Well developed, well nourished, no acute distress Eyes:  EOMI, conjunctiva normal bilaterally HENT: Normocephalic, atraumatic,mucus membranes moist Respiratory: Normal inspiratory effort Cardiovascular: Normal rate GI: nondistended.  Positive suprapubic tenderness.  No flank tenderness. Back: No CVAT skin: No rash, skin intact Musculoskeletal: no deformities Neurologic: Alert & oriented x 3, no focal neuro deficits Psychiatric: Speech and behavior appropriate   ED Course   Medications - No data to display  Orders Placed This Encounter  Procedures  . Urine culture    Standing Status:   Standing    Number of Occurrences:   1    Order Specific Question:   List patient's active antibiotics    Answer:   keflex    Order Specific Question:   Patient immune status    Answer:   Normal  . Urinalysis, Complete w Microscopic    Standing Status:   Standing    Number of Occurrences:   1    No results found for this or any previous visit (from the past 24 hour(s)). No results found.  ED Clinical Impression  Acute cystitis with hematuria   ED Assessment/Plan  Previous records reviewed.  Patient had a urine culture done in 2018 that grew out multiple species.  No other urine cultures available.  H&P consistent with a UTI.  UA consistent with this as well.  We will send this off for culture to confirm antibiotic choice.  Home with Keflex, Pyridium, push fluids.  Follow-up with PMD as needed, to the ER if she gets worse.  Discussed labs, MDM, treatment plan, and plan for follow-up with patient. Discussed sn/sx  that should prompt return to the ED. patient agrees with plan.   Meds ordered this encounter  Medications  . cephALEXin (KEFLEX) 500 MG capsule    Sig: Take 1 capsule (500 mg total) by mouth 2 (two) times daily.    Dispense:  14 capsule    Refill:  0  . phenazopyridine (PYRIDIUM) 200 MG tablet    Sig: Take 1 tablet (200 mg total) by mouth 3 (three) times daily as needed for pain.    Dispense:  6 tablet    Refill:  0    *This clinic note was created using Dragon  dictation software. Therefore, there may be occasional mistakes despite careful proofreading.   ?    Melynda Ripple, MD 01/18/18 1406

## 2018-01-20 LAB — URINE CULTURE: Special Requests: NORMAL

## 2018-01-21 ENCOUNTER — Telehealth (HOSPITAL_COMMUNITY): Payer: Self-pay | Admitting: Emergency Medicine

## 2018-01-21 NOTE — Telephone Encounter (Signed)
Urine culture was positive for Citrobacter koseri and was given cephalexin  at urgent care visit. Pt contacted and made aware, educated on completing antibiotic and to follow up if symptoms are persistent. Verbalized understanding. LVMM

## 2018-03-07 IMAGING — DX DG KNEE 1-2V PORT*L*
2 series · 2 of 2 positions shown · non-contrast
Comparison: MRI 02/21/2015

CLINICAL DATA: Postop knee replacement

EXAM:
PORTABLE LEFT KNEE - 1-2 VIEW

[knee ap]
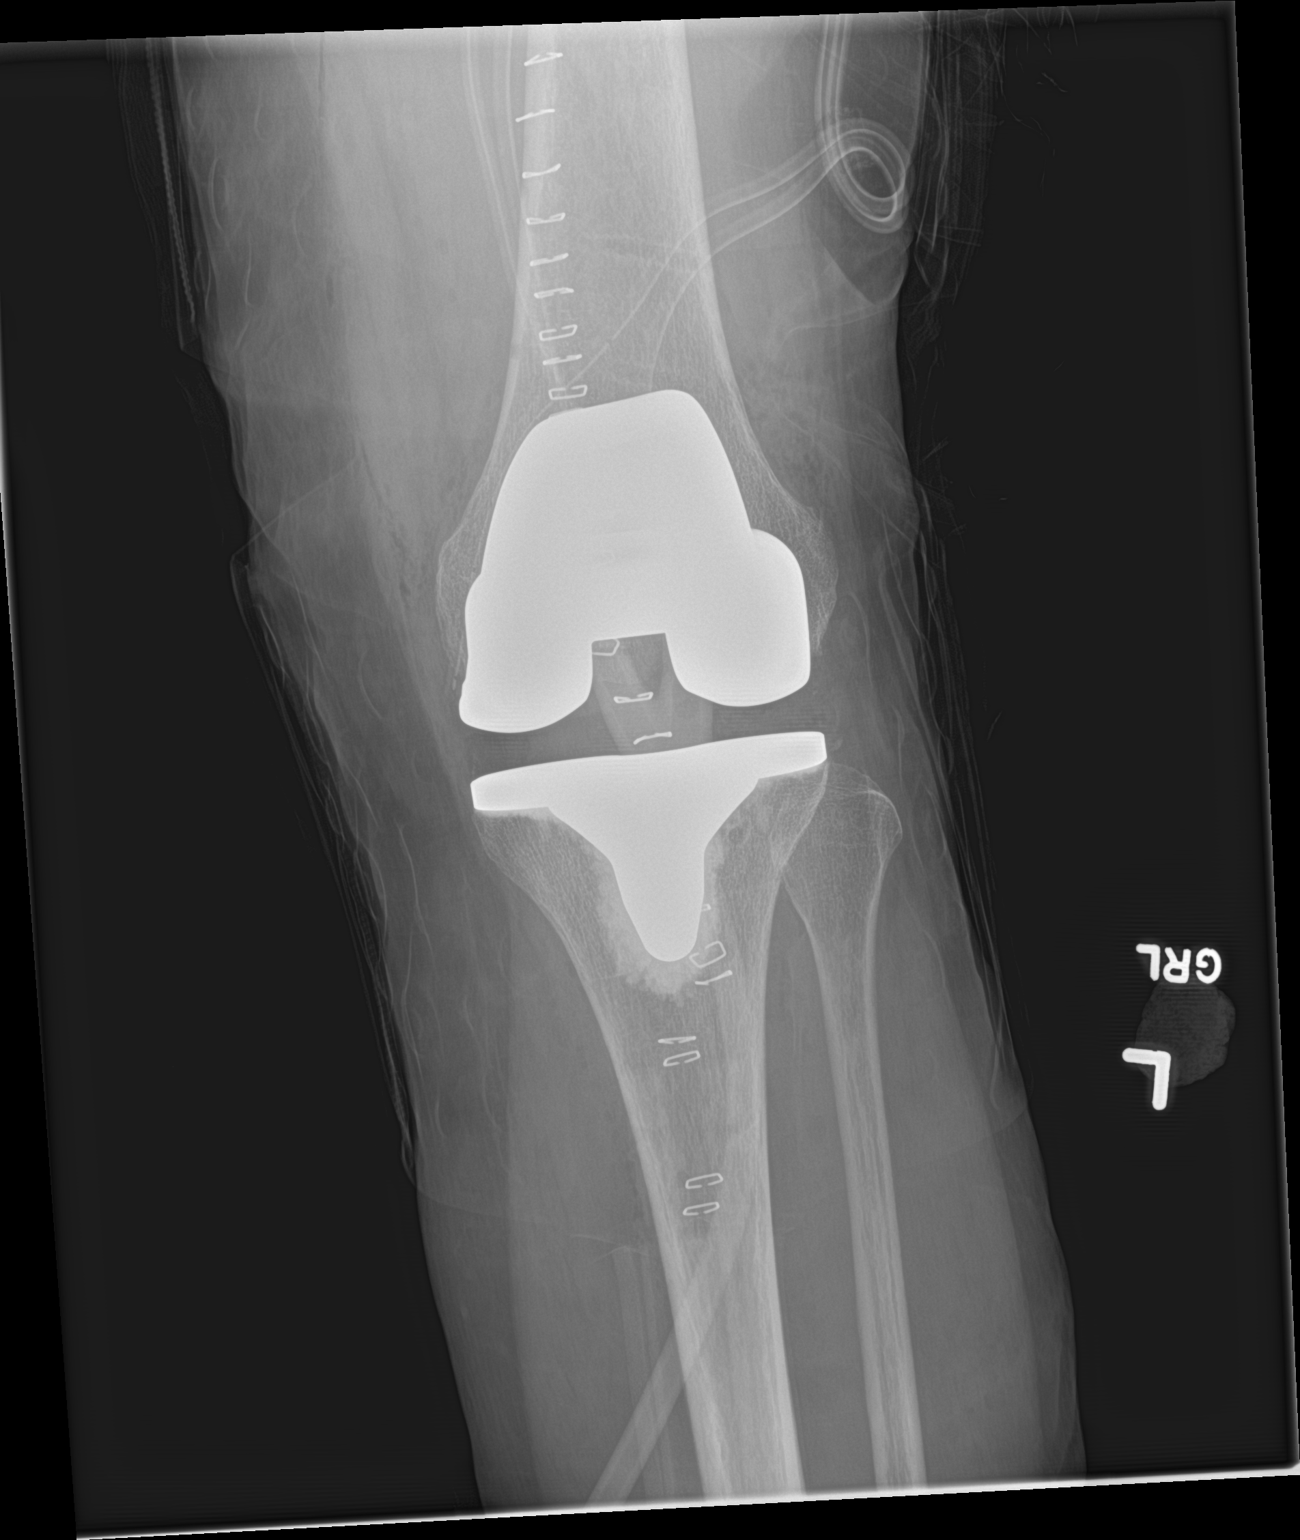

[knee lat]
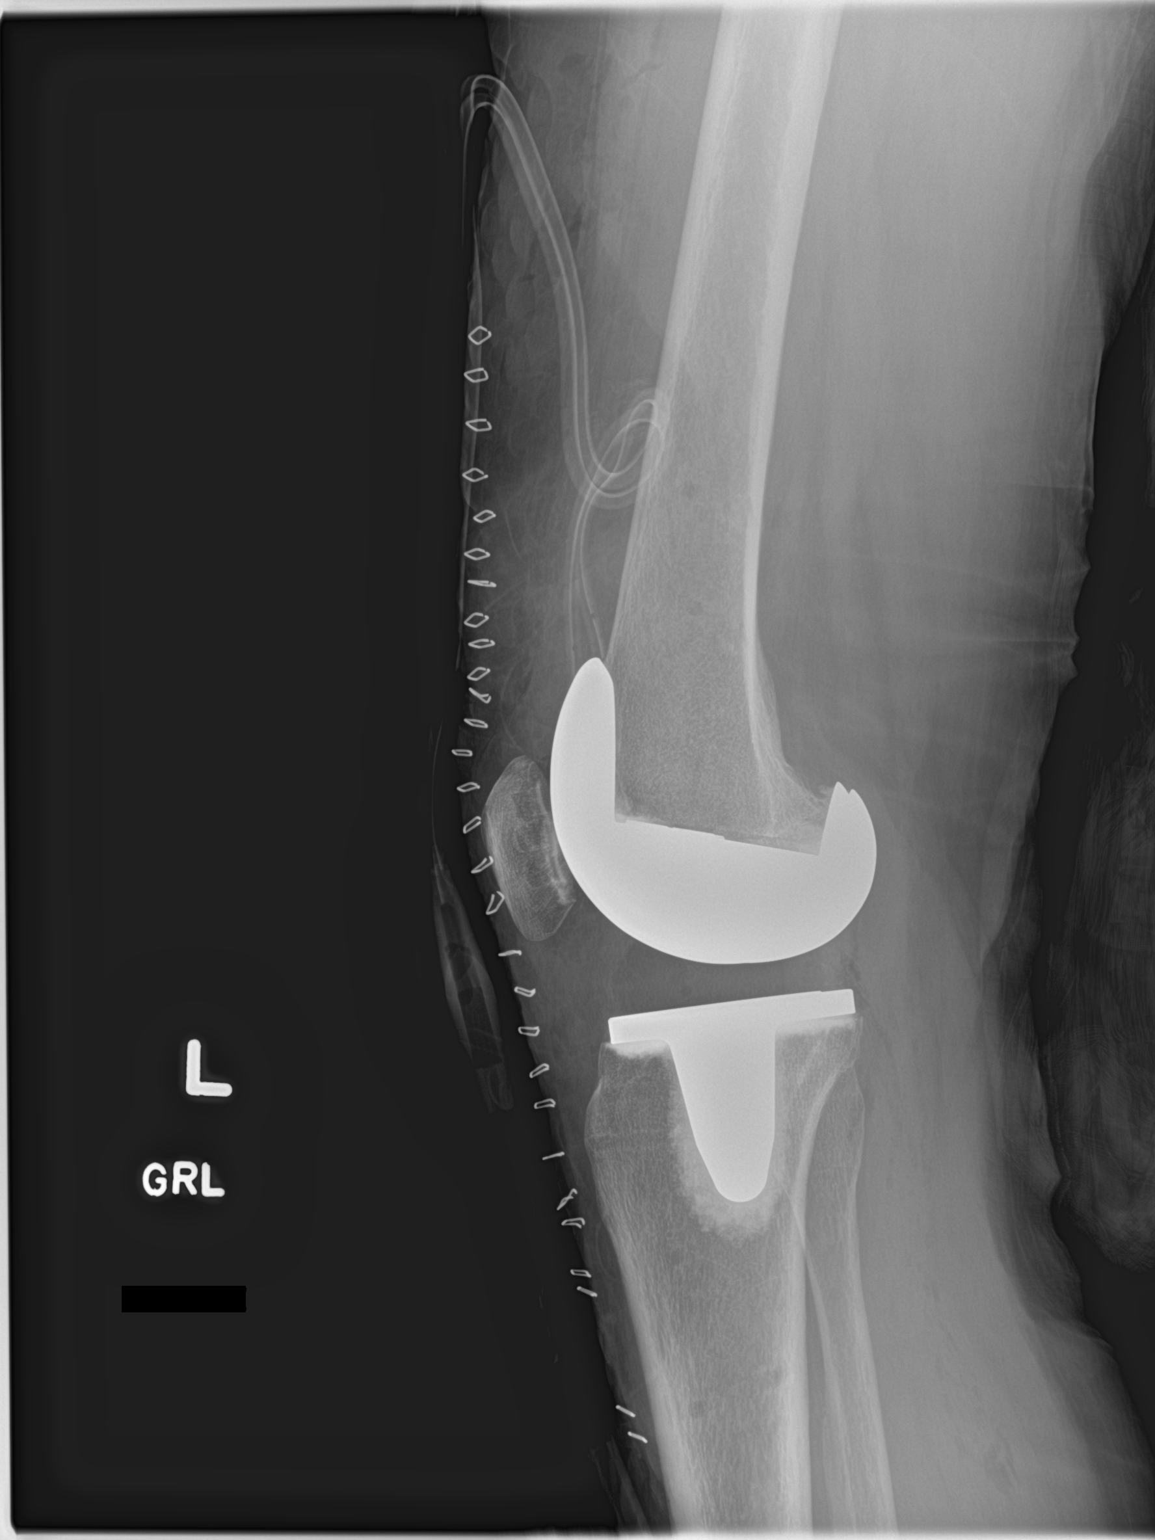

[2 of 2 positions shown; findings below may reference images not displayed]

FINDINGS: changes of left knee replacement. Soft tissue drains in place. No
hardware or bony complicating feature.
IMPRESSION: Left knee replacement.  No complicating feature.

## 2018-04-02 ENCOUNTER — Ambulatory Visit
Admission: RE | Admit: 2018-04-02 | Discharge: 2018-04-02 | Disposition: A | Discharge status: Home / Self Care | Payer: Medicare Other | Source: Ambulatory Visit | Attending: Unknown Physician Specialty | Admitting: Unknown Physician Specialty

## 2018-04-02 ENCOUNTER — Other Ambulatory Visit: Payer: Self-pay

## 2018-04-02 DIAGNOSIS — K1123 Chronic sialoadenitis: Secondary | ICD-10-CM | POA: Diagnosis present

## 2018-04-02 DIAGNOSIS — R221 Localized swelling, mass and lump, neck: Secondary | ICD-10-CM

## 2018-04-02 LAB — POCT I-STAT CREATININE: Creatinine, Ser: 0.8 mg/dL (ref 0.44–1.00)

## 2018-04-02 MED ORDER — IOHEXOL 300 MG/ML  SOLN
75.0000 mL | Freq: Once | INTRAMUSCULAR | Status: AC | PRN
  Administered 2018-04-02: 75 mL via INTRAVENOUS

## 2018-07-26 ENCOUNTER — Other Ambulatory Visit: Payer: Self-pay | Admitting: Family Medicine

## 2018-07-26 DIAGNOSIS — Z1231 Encounter for screening mammogram for malignant neoplasm of breast: Secondary | ICD-10-CM

## 2018-09-01 ENCOUNTER — Ambulatory Visit
Admission: RE | Admit: 2018-09-01 | Discharge: 2018-09-01 | Disposition: A | Discharge status: Home / Self Care | Payer: Medicare Other | Source: Ambulatory Visit | Attending: Family Medicine | Admitting: Family Medicine

## 2018-09-01 ENCOUNTER — Other Ambulatory Visit: Payer: Self-pay

## 2018-09-01 DIAGNOSIS — Z1231 Encounter for screening mammogram for malignant neoplasm of breast: Secondary | ICD-10-CM | POA: Diagnosis present

## 2019-07-19 ENCOUNTER — Other Ambulatory Visit: Payer: Self-pay | Admitting: Family Medicine

## 2019-07-19 DIAGNOSIS — Z1231 Encounter for screening mammogram for malignant neoplasm of breast: Secondary | ICD-10-CM

## 2019-09-05 ENCOUNTER — Other Ambulatory Visit: Payer: Self-pay

## 2019-09-05 ENCOUNTER — Ambulatory Visit
Admission: RE | Admit: 2019-09-05 | Discharge: 2019-09-05 | Disposition: A | Discharge status: Home / Self Care | Payer: Medicare PPO | Source: Ambulatory Visit | Attending: Family Medicine | Admitting: Family Medicine

## 2019-09-05 DIAGNOSIS — Z1231 Encounter for screening mammogram for malignant neoplasm of breast: Secondary | ICD-10-CM | POA: Diagnosis not present

## 2019-11-22 ENCOUNTER — Other Ambulatory Visit: Payer: Self-pay | Admitting: Internal Medicine

## 2019-11-22 ENCOUNTER — Ambulatory Visit: Payer: Medicare PPO | Attending: Internal Medicine

## 2019-11-22 DIAGNOSIS — Z23 Encounter for immunization: Secondary | ICD-10-CM

## 2019-11-22 NOTE — Progress Notes (Signed)
   Covid-19 Vaccination Clinic  Name:  Donna Jefferson    MRN: [id number] DOB: 1940-08-22  11/22/2019  Donna Jefferson was observed post Covid-19 immunization for 30 minutes based on pre-vaccination screening without incident. She was provided with Vaccine Information Sheet and instruction to access the V-Safe system.   Donna Jefferson was instructed to call 911 with any severe reactions post vaccine: Marland Kitchen Difficulty breathing  . Swelling of face and throat  . A fast heartbeat  . A bad rash all over body  . Dizziness and weakness

## 2019-12-05 ENCOUNTER — Ambulatory Visit (INDEPENDENT_AMBULATORY_CARE_PROVIDER_SITE_OTHER): Payer: Medicare PPO | Admitting: Dermatology

## 2019-12-05 ENCOUNTER — Other Ambulatory Visit: Payer: Self-pay

## 2019-12-05 DIAGNOSIS — L814 Other melanin hyperpigmentation: Secondary | ICD-10-CM | POA: Diagnosis not present

## 2019-12-05 DIAGNOSIS — Z1283 Encounter for screening for malignant neoplasm of skin: Secondary | ICD-10-CM | POA: Diagnosis not present

## 2019-12-05 DIAGNOSIS — L570 Actinic keratosis: Secondary | ICD-10-CM | POA: Diagnosis not present

## 2019-12-05 DIAGNOSIS — L821 Other seborrheic keratosis: Secondary | ICD-10-CM

## 2019-12-05 DIAGNOSIS — L820 Inflamed seborrheic keratosis: Secondary | ICD-10-CM | POA: Diagnosis not present

## 2019-12-05 DIAGNOSIS — Z85828 Personal history of other malignant neoplasm of skin: Secondary | ICD-10-CM

## 2019-12-05 DIAGNOSIS — L603 Nail dystrophy: Secondary | ICD-10-CM

## 2019-12-05 DIAGNOSIS — D229 Melanocytic nevi, unspecified: Secondary | ICD-10-CM

## 2019-12-05 DIAGNOSIS — D1800 Hemangioma unspecified site: Secondary | ICD-10-CM

## 2019-12-05 DIAGNOSIS — L578 Other skin changes due to chronic exposure to nonionizing radiation: Secondary | ICD-10-CM

## 2019-12-05 MED ORDER — NUVAIL EX SOLN: 1.0000 "application " | Freq: Every day | CUTANEOUS | 6 refills | Status: AC

## 2019-12-05 NOTE — Patient Instructions (Signed)
Start Nuvail solution at bedtime - if not covered, recommend Dermanail  Wound Care Instructions  1. Cleanse wound gently with soap and water once a day then pat dry with clean gauze. Apply a thing coat of Petrolatum (petroleum jelly, "Vaseline") over the wound (unless you have an allergy to this). We recommend that you use a new, sterile tube of Vaseline. Do not pick or remove scabs. Do not remove the yellow or white "healing tissue" from the base of the wound.  2. Cover the wound with fresh, clean, nonstick gauze and secure with paper tape. You may use Band-Aids in place of gauze and tape if the would is small enough, but would recommend trimming much of the tape off as there is often too much. Sometimes Band-Aids can irritate the skin.  3. You should call the office for your biopsy report after 1 week if you have not already been contacted.  4. If you experience any problems, such as abnormal amounts of bleeding, swelling, significant bruising, significant pain, or evidence of infection, please call the office immediately.  5. FOR ADULT SURGERY PATIENTS: If you need something for pain relief you may take 1 extra strength Tylenol (acetaminophen) AND 2 Ibuprofen (200mg  each) together every 4 hours as needed for pain. (do not take these if you are allergic to them or if you have a reason you should not take them.) Typically, you may only need pain medication for 1 to 3 days.

## 2019-12-05 NOTE — Progress Notes (Signed)
Follow-Up Visit   Subjective  Donna Jefferson is a 79 y.o. female who presents for the following: Annual Exam (History of SCC - TBSE) and Other (Spots of right thigh and scalp that are scaly). The patient presents for Total-Body Skin Exam (TBSE) for skin cancer screening and mole check.  The following portions of the chart were reviewed this encounter and updated as appropriate:  Tobacco  Allergies  Meds  Problems  Med Hx  Surg Hx  Fam Hx     Review of Systems:  No other skin or systemic complaints except as noted in HPI or Assessment and Plan.  Objective  Well appearing patient in no apparent distress; mood and affect are within normal limits.  A full examination was performed including scalp, head, eyes, ears, nose, lips, neck, chest, axillae, abdomen, back, buttocks, bilateral upper extremities, bilateral lower extremities, hands, feet, fingers, toes, fingernails, and toenails. All findings within normal limits unless otherwise noted below.  Objective  Scalp x 1, right medial thigh x 1, right hand x 4, left hand x 1 (7): Erythematous keratotic or waxy stuck-on papule or plaque.   Objective  Fingernails: Splitting  Objective  Chest (6): Erythematous thin papules/macules with gritty scale.    Assessment & Plan    History of Squamous Cell Carcinoma of the Skin - No evidence of recurrence today - No lymphadenopathy - Recommend regular full body skin exams - Recommend daily broad spectrum sunscreen SPF 30+ to sun-exposed areas, reapply every 2 hours as needed.  - Call if any new or changing lesions are noted between office visits   Lentigines - Scattered tan macules - Discussed due to sun exposure - Benign, observe - Call for any changes  Seborrheic Keratoses - Stuck-on, waxy, tan-brown papules and plaques  - Discussed benign etiology and prognosis. - Observe - Call for any changes  Melanocytic Nevi - Tan-brown and/or pink-flesh-colored symmetric macules  and papules - Benign appearing on exam today - Observation - Call clinic for new or changing moles - Recommend daily use of broad spectrum spf 30+ sunscreen to sun-exposed areas.   Hemangiomas - Red papules - Discussed benign nature - Observe - Call for any changes  Actinic Damage - Chronic, secondary to cumulative UV/sun exposure - diffuse scaly erythematous macules with underlying dyspigmentation - Recommend daily broad spectrum sunscreen SPF 30+ to sun-exposed areas, reapply every 2 hours as needed.  - Call for new or changing lesions.  Skin cancer screening performed today.   Inflamed seborrheic keratosis (7) Scalp x 1, right medial thigh x 1, right hand x 4, left hand x 1  Destruction of lesion - Scalp x 1, right medial thigh x 1, right hand x 4, left hand x 1 Complexity: simple   Destruction method: cryotherapy   Informed consent: discussed and consent obtained   Timeout:  patient name, date of birth, surgical site, and procedure verified Lesion destroyed using liquid nitrogen: Yes   Region frozen until ice ball extended beyond lesion: Yes   Outcome: patient tolerated procedure well with no complications   Post-procedure details: wound care instructions given    Onychoschizia Fingernails Chronic and persistent - age and frequent hand washing and drying hand sanitizers exacerbate it. Start Nuvail solution qhs - if not covered, recommend Dermanail  Dermatological Products, Misc. (NUVAIL) SOLN - Fingernails  AK (actinic keratosis) (6) Chest  Destruction of lesion - Chest Complexity: simple   Destruction method: cryotherapy   Informed consent: discussed and consent obtained  Timeout:  patient name, date of birth, surgical site, and procedure verified Lesion destroyed using liquid nitrogen: Yes   Region frozen until ice ball extended beyond lesion: Yes   Outcome: patient tolerated procedure well with no complications   Post-procedure details: wound care  instructions given    Return in about 1 year (around 12/04/2020) for TBSE.   I, Ashok Cordia, CMA, am acting as scribe for Sarina Ser, MD .  Documentation: I have reviewed the above documentation for accuracy and completeness, and I agree with the above.  Sarina Ser, MD

## 2019-12-06 ENCOUNTER — Encounter: Payer: Self-pay | Admitting: Dermatology

## 2019-12-10 IMAGING — CT CT NECK W/ CM
5 of 6 series · 14 of 33 positions shown, 16 images · IV contrast (iopamidol)
Comparison: None.

Addendum:
CLINICAL DATA: Right submandibular swelling since June 2017

EXAM:
CT NECK WITH CONTRAST
TECHNIQUE: Multidetector CT imaging of the neck was performed using the
standard protocol following the bolus administration of intravenous
contrast.
CONTRAST:  75mL CFU5YL-ILL IOPAMIDOL (CFU5YL-ILL) INJECTION 61%

[Series 2: axial neck neck (person_name) · axial · 0.51mm/px · z∈[-722,-636]mm · 2 of 131 slices shown]
[im 44/131  bone]
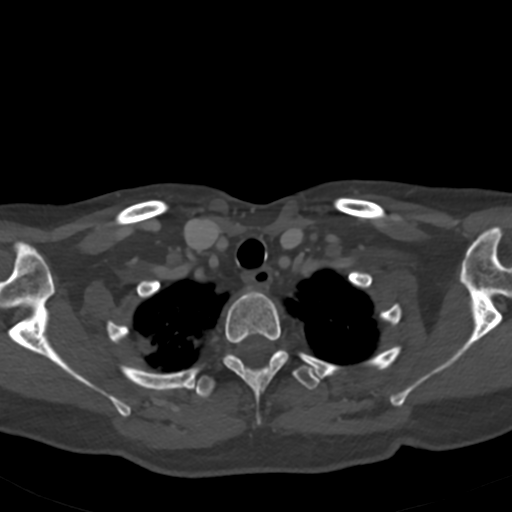
[im 87/131  bone]
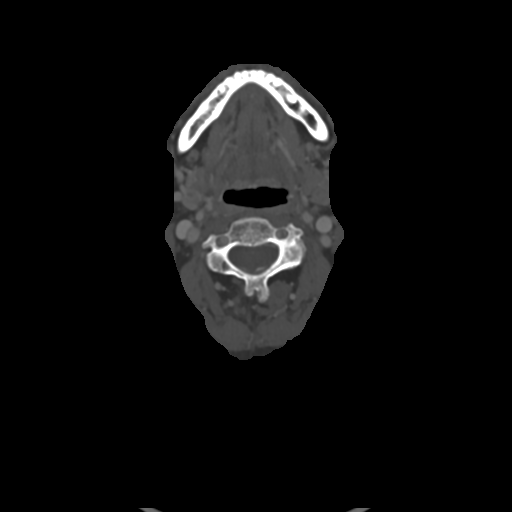

[Series 3: axial bone neck · axial · 0.51mm/px · z∈[-722,-636]mm · 2 of 131 slices shown]
[im 44/131  bone]
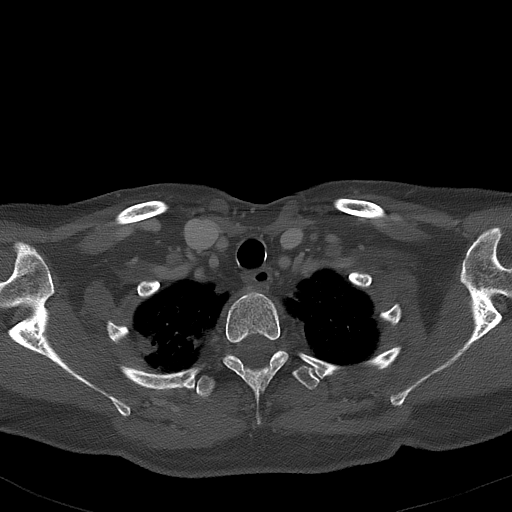
[im 87/131  bone]
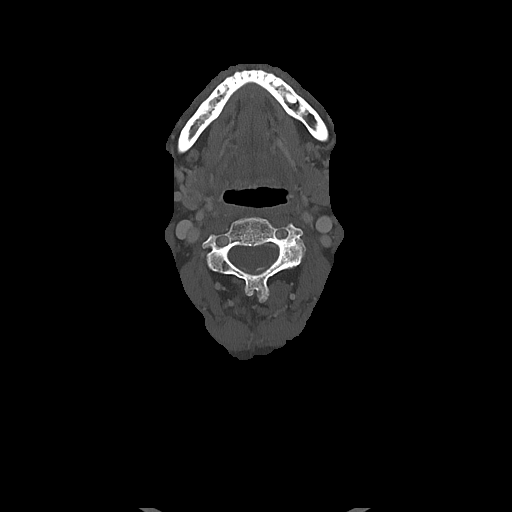

[Series 5: coronal neck neck (person_name) · coronal · 0.51mm/px · 3 of 127 slices shown]
[im 30/127  bone]
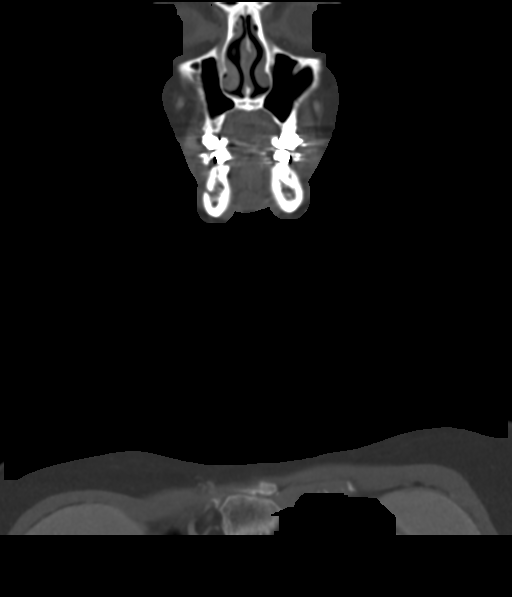
[im 52/127  bone]
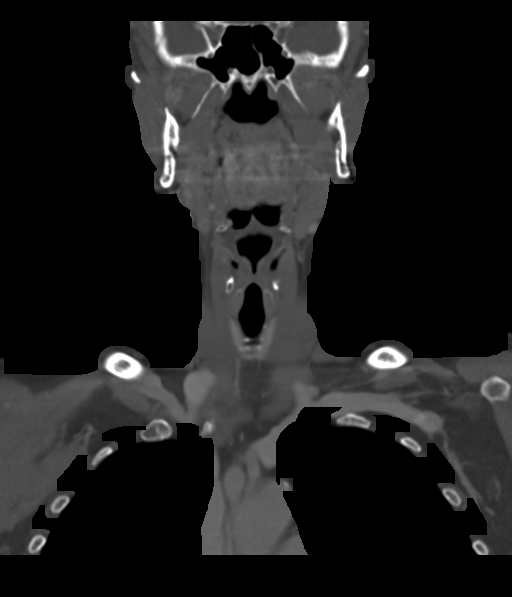
[im 75/127  bone]
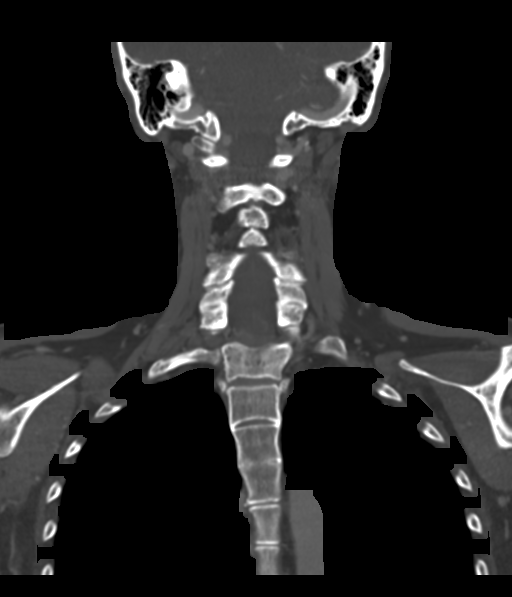

[Series 7: sagittal neck neck (person_name) · sagittal · 0.50mm/px · 5 of 129 slices shown, 6 images]
[im 43/129  bone]
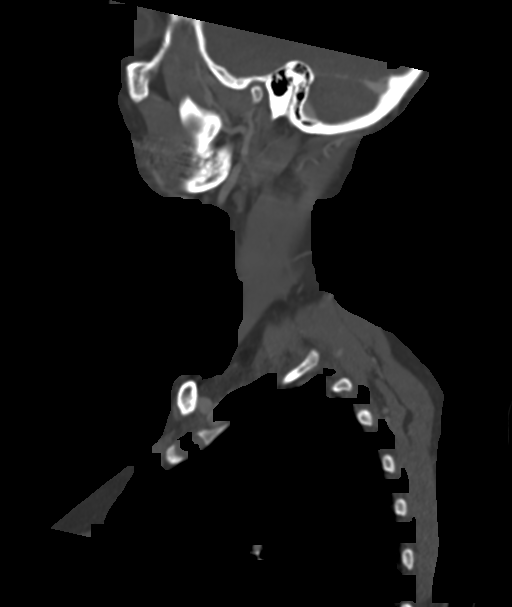
[im 54/129  bone]
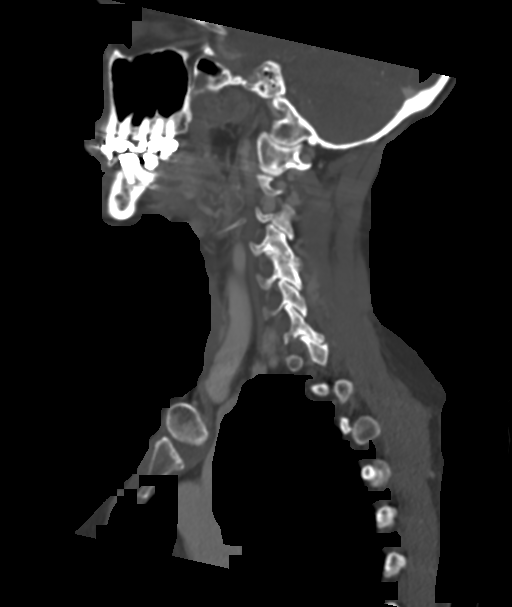
[im 65/129  soft-tissue]
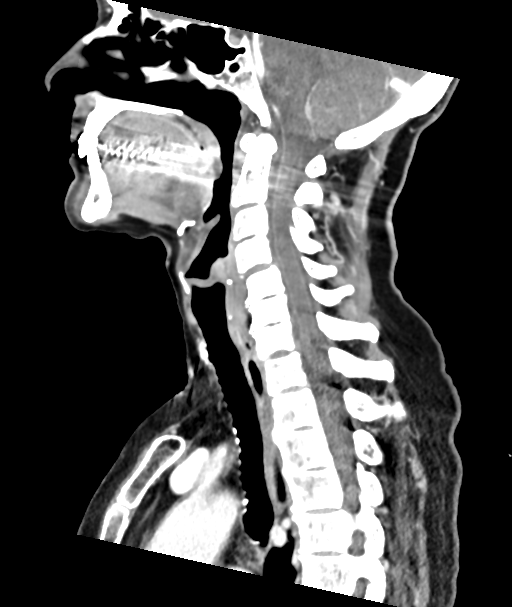
[im 65/129  bone]
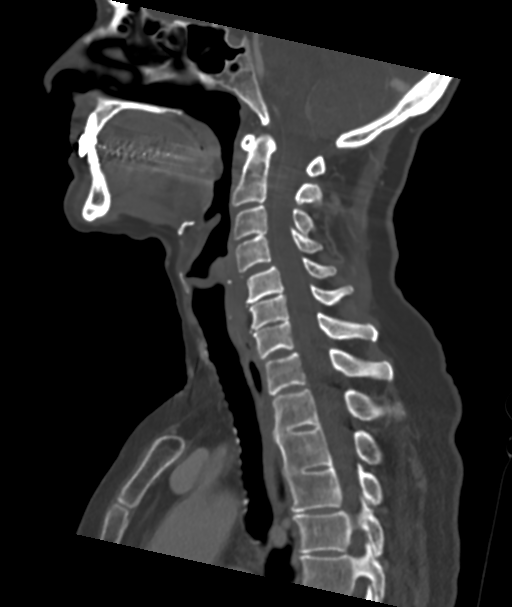
[im 75/129  bone]
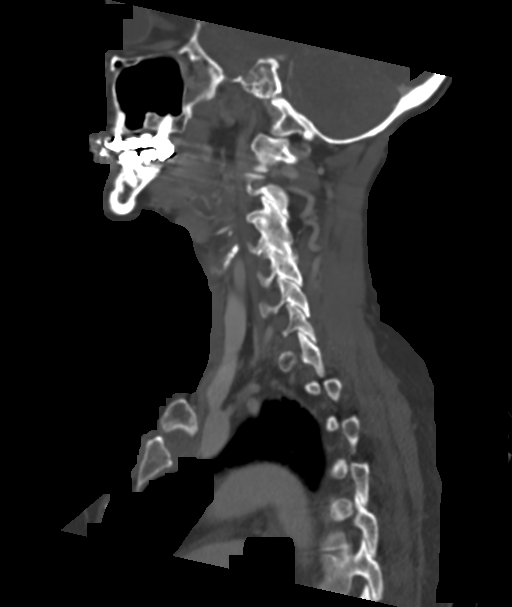
[im 86/129  bone]
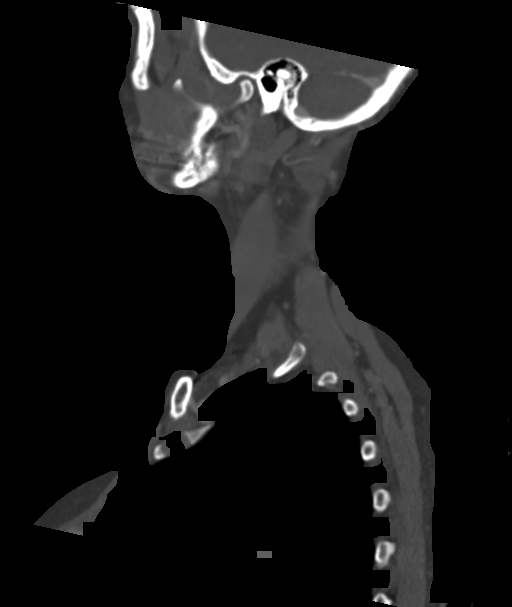

[Series 9: ax oropharynx neck neck (person_name) · axial · 0.50mm/px · z∈[-755,-657]mm · 2 of 150 slices shown, 3 images]
[im 50/150  soft-tissue]
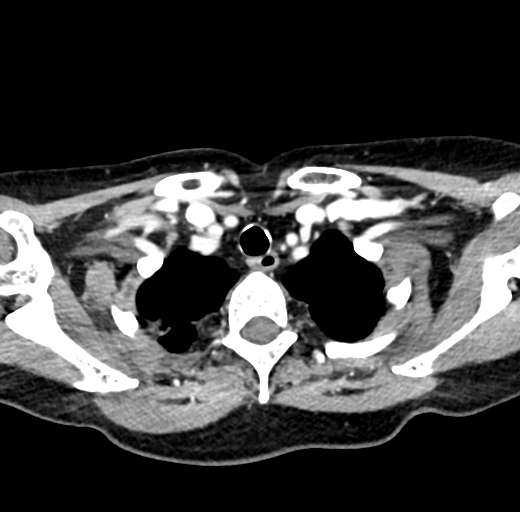
[im 50/150  bone]
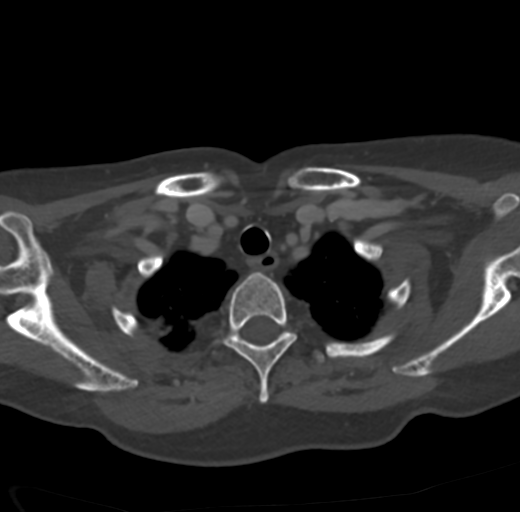
[im 100/150  bone]
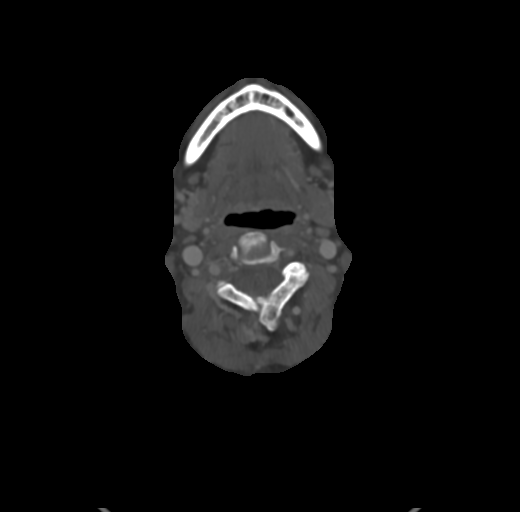

[14 of 33 positions shown; findings below may reference images not displayed]

FINDINGS: Pharynx and larynx: No evidence of mass or swelling.

Salivary glands: Small right submandibular gland with asymmetric
increased enhancement. No discrete mass is seen; relatively
hypoenhancing soft tissue deep to the gland is the posterior belly
of the digastric.. No fat stranding, stone, or ductal dilatation.
This volume loss has occurred since a neck MRA 03/23/2011

Thyroid: Tiny bilateral thyroid nodules measuring up to 3 mm.

Lymph nodes: None enlarged or abnormal density

Vascular: Negative

Limited intracranial: Negative

Visualized orbits: Negative

Mastoids and visualized paranasal sinuses: Clear

Skeleton: Degenerative facet spurring with mild C4-5
anterolisthesis. Incomplete segmentation versus ankylosis of upper
thoracic bodies. Prominent right paracentral herniation at C7-T1,
where there is also mild anterolisthesis.

Upper chest: Negative
IMPRESSION: 1. Relative atrophy of the right submandibular gland without
discrete mass lesion. This volume loss has occurred since neck MRA
in 6221 and is presumably related to interval sialadenitis.
Recommend clinical follow-up and repeat imaging if necessary
2. Spinal degeneration with notable right paracentral herniation at
C7-T1

ADDENDUM:
Contacted about residual clinical concern in the right submandibular
region due to degree of palpable abnormality. If surveillance is
elected, a face MRI with contrast may be able to differentiate the
atrophic gland, posterior belly digastric, and the palpable mass. As
before, volume loss of the right gland is reassuring and a
surveillance neck CT with contrast could also be obtained in 3 to 6
months.

*** End of Addendum ***

## 2020-06-08 ENCOUNTER — Ambulatory Visit: Payer: Medicare PPO | Attending: Internal Medicine

## 2020-06-08 DIAGNOSIS — Z23 Encounter for immunization: Secondary | ICD-10-CM

## 2020-06-08 NOTE — Progress Notes (Signed)
   Covid-19 Vaccination Clinic  Name:  Donna Jefferson    MRN: [id number] DOB: 12-Jun-1940  06/08/2020  [name] was observed post Covid-19 immunization for 15 minutes without incident. She was provided with Vaccine Information Sheet and instruction to access the V-Safe system.   [name] was instructed to call 911 with any severe reactions post vaccine: Marland Kitchen Difficulty breathing  . Swelling of face and throat  . A fast heartbeat  . A bad rash all over body  . Dizziness and weakness   Immunizations Administered    Name Date Dose VIS Date Route   PFIZER Comrnaty(Gray TOP) Covid-19 Vaccine 06/08/2020  2:10 PM 0.3 mL 01/05/2020 Intramuscular   Manufacturer: [place]   Lot: [id number]   NDC: [place], PharmD, MBA Clinical Acute Care Pharmacist

## 2020-06-12 ENCOUNTER — Other Ambulatory Visit: Payer: Self-pay

## 2020-06-12 MED ORDER — PFIZER-BIONT COVID-19 VAC-TRIS 30 MCG/0.3ML IM SUSP
INTRAMUSCULAR | 0 refills | Status: AC
  Filled 2020-06-12: qty 0.3, 1d supply, fill #0

## 2020-07-16 ENCOUNTER — Other Ambulatory Visit: Payer: Self-pay | Admitting: Family Medicine

## 2020-07-16 DIAGNOSIS — Z1231 Encounter for screening mammogram for malignant neoplasm of breast: Secondary | ICD-10-CM

## 2020-09-06 ENCOUNTER — Other Ambulatory Visit: Payer: Self-pay

## 2020-09-06 ENCOUNTER — Ambulatory Visit
Admission: RE | Admit: 2020-09-06 | Discharge: 2020-09-06 | Disposition: A | Discharge status: Home / Self Care | Payer: Medicare PPO | Source: Ambulatory Visit | Attending: Family Medicine | Admitting: Family Medicine

## 2020-09-06 DIAGNOSIS — Z1231 Encounter for screening mammogram for malignant neoplasm of breast: Secondary | ICD-10-CM | POA: Insufficient documentation

## 2020-11-29 ENCOUNTER — Ambulatory Visit: Payer: Medicare PPO | Attending: Internal Medicine

## 2020-11-29 ENCOUNTER — Other Ambulatory Visit: Payer: Self-pay

## 2020-11-29 DIAGNOSIS — Z23 Encounter for immunization: Secondary | ICD-10-CM

## 2020-11-29 MED ORDER — PFIZER COVID-19 VAC BIVALENT 30 MCG/0.3ML IM SUSP
INTRAMUSCULAR | 0 refills | Status: AC
  Filled 2020-11-29: qty 0.3, 1d supply, fill #0

## 2020-11-29 NOTE — Progress Notes (Signed)
   Covid-19 Vaccination Clinic  Name:  Donna Jefferson    MRN: [id number] DOB: 12/03/1940  11/29/2020  [name] was observed post Covid-19 immunization for 15 minutes without incident. She was provided with Vaccine Information Sheet and instruction to access the V-Safe system.   [name] was instructed to call 911 with any severe reactions post vaccine: Difficulty breathing  Swelling of face and throat  A fast heartbeat  A bad rash all over body  Dizziness and weakness   Immunizations Administered     Name Date Dose VIS Date Route   Pfizer Covid-19 Vaccine Bivalent Booster 11/29/2020  9:55 AM 0.3 mL 09/26/2020 Intramuscular   Manufacturer: [place]   Lot: [id number]   [place]: [phone]

## 2020-12-10 ENCOUNTER — Ambulatory Visit: Payer: Medicare PPO | Admitting: Dermatology

## 2020-12-10 ENCOUNTER — Other Ambulatory Visit: Payer: Self-pay

## 2020-12-10 DIAGNOSIS — D229 Melanocytic nevi, unspecified: Secondary | ICD-10-CM

## 2020-12-10 DIAGNOSIS — L814 Other melanin hyperpigmentation: Secondary | ICD-10-CM | POA: Diagnosis not present

## 2020-12-10 DIAGNOSIS — L821 Other seborrheic keratosis: Secondary | ICD-10-CM

## 2020-12-10 DIAGNOSIS — L570 Actinic keratosis: Secondary | ICD-10-CM | POA: Diagnosis not present

## 2020-12-10 DIAGNOSIS — Z1283 Encounter for screening for malignant neoplasm of skin: Secondary | ICD-10-CM

## 2020-12-10 DIAGNOSIS — L820 Inflamed seborrheic keratosis: Secondary | ICD-10-CM

## 2020-12-10 DIAGNOSIS — Z85828 Personal history of other malignant neoplasm of skin: Secondary | ICD-10-CM | POA: Diagnosis not present

## 2020-12-10 DIAGNOSIS — L578 Other skin changes due to chronic exposure to nonionizing radiation: Secondary | ICD-10-CM

## 2020-12-10 DIAGNOSIS — D1801 Hemangioma of skin and subcutaneous tissue: Secondary | ICD-10-CM

## 2020-12-10 NOTE — Patient Instructions (Signed)

## 2020-12-10 NOTE — Progress Notes (Signed)
Follow-Up Visit   Subjective  Donna Jefferson is a 80 y.o. female who presents for the following: Annual Exam (History of SCC - TBSE today). The patient presents for Total-Body Skin Exam (TBSE) for skin cancer screening and mole check.  The following portions of the chart were reviewed this encounter and updated as appropriate:   Tobacco  Allergies  Meds  Problems  Med Hx  Surg Hx  Fam Hx     Review of Systems:  No other skin or systemic complaints except as noted in HPI or Assessment and Plan.  Objective  Well appearing patient in no apparent distress; mood and affect are within normal limits.  A full examination was performed including scalp, head, eyes, ears, nose, lips, neck, chest, axillae, abdomen, back, buttocks, bilateral upper extremities, bilateral lower extremities, hands, feet, fingers, toes, fingernails, and toenails. All findings within normal limits unless otherwise noted below.  Right scalp x 1, right forearm x 1 (2) Erythematous keratotic or waxy stuck-on papule or plaque.   Left chest x 2 (2) Erythematous thin papules/macules with gritty scale.    Assessment & Plan   Lentigines - Scattered tan macules - Due to sun exposure - Benign-appearing, observe - Recommend daily broad spectrum sunscreen SPF 30+ to sun-exposed areas, reapply every 2 hours as needed. - Call for any changes  Seborrheic Keratoses - Stuck-on, waxy, tan-brown papules and/or plaques  - Benign-appearing - Discussed benign etiology and prognosis. - Observe - Call for any changes - Discussed LN2 vs laser treatment - Fee for LN2 is $60 for first lesion and $15 each additional and fee for laser treatment is $350/treatment.  Melanocytic Nevi - Tan-brown and/or pink-flesh-colored symmetric macules and papules - Benign appearing on exam today - Observation - Call clinic for new or changing moles - Recommend daily use of broad spectrum spf 30+ sunscreen to sun-exposed areas.    Hemangiomas - Red papules - Discussed benign nature - Observe - Call for any changes  Actinic Damage - Chronic condition, secondary to cumulative UV/sun exposure - diffuse scaly erythematous macules with underlying dyspigmentation - Recommend daily broad spectrum sunscreen SPF 30+ to sun-exposed areas, reapply every 2 hours as needed.  - Staying in the shade or wearing long sleeves, sun glasses (UVA+UVB protection) and wide brim hats (4-inch brim around the entire circumference of the hat) are also recommended for sun protection.  - Call for new or changing lesions.  Skin cancer screening performed today.  Inflamed seborrheic keratosis Right scalp x 1, right forearm x 1  Destruction of lesion - Right scalp x 1, right forearm x 1 Complexity: simple   Destruction method: cryotherapy   Informed consent: discussed and consent obtained   Timeout:  patient name, date of birth, surgical site, and procedure verified Lesion destroyed using liquid nitrogen: Yes   Region frozen until ice ball extended beyond lesion: Yes   Outcome: patient tolerated procedure well with no complications   Post-procedure details: wound care instructions given    AK (actinic keratosis) (2) Left chest x 2  Destruction of lesion - Left chest x 2 Complexity: simple   Destruction method: cryotherapy   Informed consent: discussed and consent obtained   Timeout:  patient name, date of birth, surgical site, and procedure verified Lesion destroyed using liquid nitrogen: Yes   Region frozen until ice ball extended beyond lesion: Yes   Outcome: patient tolerated procedure well with no complications   Post-procedure details: wound care instructions given  Skin cancer screening  Return in about 3 months (around 03/12/2021) for ISK follow up.  I, Ashok Cordia, CMA, am acting as scribe for Sarina Ser, MD . Documentation: I have reviewed the above documentation for accuracy and completeness, and I agree with  the above.  Sarina Ser, MD

## 2020-12-16 ENCOUNTER — Encounter: Payer: Self-pay | Admitting: Dermatology

## 2021-01-01 ENCOUNTER — Institutional Professional Consult (permissible substitution): Payer: Medicare PPO | Admitting: Plastic Surgery

## 2021-01-04 ENCOUNTER — Encounter: Payer: Self-pay | Admitting: Plastic Surgery

## 2021-01-04 ENCOUNTER — Ambulatory Visit: Payer: Medicare PPO | Admitting: Plastic Surgery

## 2021-01-04 ENCOUNTER — Other Ambulatory Visit: Payer: Self-pay

## 2021-01-04 DIAGNOSIS — T8543XA Leakage of breast prosthesis and implant, initial encounter: Secondary | ICD-10-CM | POA: Insufficient documentation

## 2021-01-04 NOTE — Progress Notes (Signed)
Patient ID: Donna Jefferson, female    DOB: 1940/06/25, 80 y.o.   MRN: [id number]   Chief Complaint  Patient presents with   Advice Only   Breast Problem    The patient is an 80 year old female here for evaluation of her implants.  She had bilateral silicone implants placed 40 years ago by Dr. Wendy Poet.  They were placed for cosmetic reasons and no history of breast cancer.  On her 2019 mammogram she was noted to have rupture of the left implant and it was not clear if the right implant was ruptured or not .  She does not have any pain and she is otherwise in good condition.  She has grade 2 ptosis of her breasts but she looks good and close and this does not bother her at all.  Her most recent mammogram was done at the normal breast clinic in [facility] August 2022 and there was no significant change from the 2019 implant.  I don't palpate any lumps, bumps or concerning areas.  She describes herself as vain but is nervous about surgery.   Review of Systems  Constitutional: Negative.   HENT: Negative.    Eyes: Negative.   Respiratory: Negative.    Cardiovascular: Negative.   Gastrointestinal: Negative.   Endocrine: Negative.   Genitourinary: Negative.   Musculoskeletal: Negative.   Skin: Negative.   Hematological: Negative.   Psychiatric/Behavioral: Negative.     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 SCCis   Squamous cell skin cancer 11/02/2013   Left medial infraclavicular. SCCis, early evolving.   Squamous cell skin cancer 07/29/2018   Left distal pretibial. WD SCC with superficial infiltration.    Past Surgical History:  Procedure Laterality Date   ABDOMINAL HYSTERECTOMY     APPENDECTOMY     AUGMENTATION MAMMAPLASTY Bilateral 1980's   CATARACT  EXTRACTION, BILATERAL     CHOLECYSTECTOMY     COLONOSCOPY     EYE SURGERY Bilateral    Cataract Extraction with IOL   JOINT REPLACEMENT Right    knee   KNEE ARTHROPLASTY Left 04/09/2016   Procedure: COMPUTER ASSISTED TOTAL KNEE ARTHROPLASTY;  Surgeon: Dereck Leep, MD;  Location: [facility] ORS;  Service: Orthopedics;  Laterality: Left;   KNEE ARTHROSCOPY Left 04/02/2015   Procedure: ARTHROSCOPY KNEE. PARTIAL MEDIAL MENISECTOMY, MEDIAL CHONDROPLASTY;  Surgeon: Dereck Leep, MD;  Location: [facility] ORS;  Service: Orthopedics;  Laterality: Left;   TONSILLECTOMY     TRIGGER FINGER RELEASE Right    thumb      Current Outpatient Medications:    ALPRAZolam (XANAX) 0.25 MG tablet, Take 0.25 mg by mouth at bedtime., Disp: , Rfl:    atenolol (TENORMIN) 25 MG tablet, Take 25 mg by mouth at bedtime., Disp: , Rfl:    BIOTIN PO, Take 1 tablet by mouth 2 (two) times daily., Disp: , Rfl:    Calcium Carbonate-Vit D-Min (CALTRATE 600+D PLUS PO), Take 1 tablet by mouth 2 (two) times daily., Disp: , Rfl:    cephALEXin (KEFLEX) 500 MG capsule, Take 1 capsule (500 mg total) by mouth 2 (two) times daily., Disp: 14 capsule, Rfl: 0   COVID-19 mRNA bivalent vaccine, Pfizer, (PFIZER COVID-19 VAC BIVALENT) injection, Inject into the muscle.,  Disp: 0.3 mL, Rfl: 0   COVID-19 mRNA Vac-TriS, Pfizer, (PFIZER-BIONT COVID-19 VAC-TRIS) SUSP injection, Inject into the muscle., Disp: 0.3 mL, Rfl: 0   Dermatological Products, Misc. (NUVAIL) SOLN, Apply 1 application topically at bedtime., Disp: 15 mL, Rfl: 6   Glucosamine-Chondroitin (GLUCOSAMINE CHONDR COMPLEX PO), Take 1 tablet by mouth 2 (two) times daily. , Disp: , Rfl:    Misc Natural Products (GRAPE SEED COMPLEX PO), Take 1 capsule by mouth daily with lunch. , Disp: , Rfl:    Misc Natural Products (TART CHERRY ADVANCED PO), Take 1,200 mg by mouth daily., Disp: , Rfl:    Multiple Vitamin (MULTIVITAMIN WITH MINERALS) TABS tablet, Take 1 tablet by mouth daily. Women's One-A-Day,  Disp: , Rfl:    omeprazole (PRILOSEC) 20 MG capsule, Take 20 mg by mouth daily., Disp: , Rfl:    phenazopyridine (PYRIDIUM) 200 MG tablet, Take 1 tablet (200 mg total) by mouth 3 (three) times daily as needed for pain., Disp: 6 tablet, Rfl: 0   Polyethyl Glycol-Propyl Glycol (SYSTANE OP), Place 1 drop into both eyes 2 (two) times daily. , Disp: , Rfl:    Probiotic Product (ALIGN PO), Take 1 capsule by mouth daily. , Disp: , Rfl:    psyllium (HYDROCIL/METAMUCIL) 95 % PACK, Take 1 packet by mouth at bedtime., Disp: , Rfl:    SUMAtriptan (IMITREX) 100 MG tablet, Take 100 mg by mouth every 2 (two) hours as needed for migraine. May repeat in 2 hours if headache persists or recurs., Disp: , Rfl:    traMADol (ULTRAM) 50 MG tablet, Take 1-2 tablets (50-100 mg total) by mouth every 4 (four) hours as needed for moderate pain., Disp: 60 tablet, Rfl: 0   Objective:   Vitals:   01/04/21 1120  BP: (!) 186/71  Pulse: 76  SpO2: 97%    Physical Exam Vitals and nursing note reviewed.  Constitutional:      Appearance: Normal appearance.  HENT:     Head: Normocephalic and atraumatic.  Cardiovascular:     Rate and Rhythm: Normal rate.     Pulses: Normal pulses.  Pulmonary:     Effort: Pulmonary effort is normal.  Abdominal:     General: There is no distension.     Palpations: Abdomen is soft.  Musculoskeletal:        General: No swelling or deformity.  Skin:    General: Skin is warm.     Capillary Refill: Capillary refill takes less than 2 seconds.     Coloration: Skin is not jaundiced.     Findings: No bruising.  Neurological:     Mental Status: She is alert and oriented to person, place, and time.    Assessment & Plan:  Rupture of implant of left breast, initial encounter  We will provide the patient with a quote for breast surgery: removal of bilateral implants with replacement.  We will also plan to see her back in 1 year for follow-up.  Pictures were obtained of the patient and placed  in the chart with the patient's or guardian's permission.   [place], DO

## 2021-02-04 ENCOUNTER — Other Ambulatory Visit (HOSPITAL_COMMUNITY): Payer: Self-pay

## 2021-03-18 ENCOUNTER — Ambulatory Visit: Payer: Medicare PPO | Admitting: Dermatology

## 2021-03-25 ENCOUNTER — Encounter: Payer: Self-pay | Admitting: Dermatology

## 2021-03-25 ENCOUNTER — Ambulatory Visit: Payer: Medicare PPO | Admitting: Dermatology

## 2021-03-25 ENCOUNTER — Other Ambulatory Visit: Payer: Self-pay

## 2021-03-25 DIAGNOSIS — L820 Inflamed seborrheic keratosis: Secondary | ICD-10-CM

## 2021-03-25 DIAGNOSIS — L578 Other skin changes due to chronic exposure to nonionizing radiation: Secondary | ICD-10-CM | POA: Diagnosis not present

## 2021-03-25 DIAGNOSIS — L988 Other specified disorders of the skin and subcutaneous tissue: Secondary | ICD-10-CM | POA: Diagnosis not present

## 2021-03-25 DIAGNOSIS — Z872 Personal history of diseases of the skin and subcutaneous tissue: Secondary | ICD-10-CM

## 2021-03-25 DIAGNOSIS — R202 Paresthesia of skin: Secondary | ICD-10-CM | POA: Diagnosis not present

## 2021-03-25 MED ORDER — MOMETASONE FUROATE 0.1 % EX CREA: TOPICAL_CREAM | CUTANEOUS | 1 refills | Status: AC

## 2021-03-25 NOTE — Patient Instructions (Addendum)
Cryotherapy Aftercare  Wash gently with soap and water everyday.   Apply Vaseline and Band-Aid daily until healed.   Prior to procedure, discussed risks of blister formation, small wound, skin dyspigmentation, or rare scar following cryotherapy. Recommend Vaseline ointment to treated areas while healing.    Notalgia paresthetica is a chronic condition affecting the skin of the back in which a pinched nerve along the spine causes itching or changes in sensation in an area of skin. This is usually accompanied by chronic rubbing or scratching. There is no cure, but there are some treatments which may help control the itch.   Over the counter (non-prescription) treatments for notalgia paresthetica include numbing creams like pramoxine or lidocaine which temporarily reduce itch or Capsaicin-containing creams which cause a burning sensation but which sometimes over time will reset the nerves to stop producing itch.   If you choose to use Capsaicin cream, it is recommended to use it 5 times daily for 1 week followed by 3 times daily for 3-6 weeks. You may have to continue using it long-term. For severe cases, there are some prescription cream or pill options which may help.   Start Mometasone cream twice daily up to 2 weeks as needed for itching.    Topical steroids (such as triamcinolone, fluocinolone, fluocinonide, mometasone, clobetasol, halobetasol, betamethasone, hydrocortisone) can cause thinning and lightening of the skin if they are used for too long in the same area. Your physician has selected the right strength medicine for your problem and area affected on the body. Please use your medication only as directed by your physician to prevent side effects.    If You Need Anything After Your Visit  If you have any questions or concerns for your doctor, please call our main line at [phone] and press option 4 to reach your doctor's medical assistant. If no one answers, please leave a voicemail  as directed and we will return your call as soon as possible. Messages left after 4 pm will be answered the following business day.   You may also send [place] a message via [place]. We typically respond to MyChart messages within 1-2 business days.  For prescription refills, please ask your pharmacy to contact our office. Our fax number is [phone].  If you have an urgent issue when the clinic is closed that cannot wait until the next business day, you can page your doctor at the number below.    Please note that while we do our best to be available for urgent issues outside of office hours, we are not available 24/7.   If you have an urgent issue and are unable to reach [place], you may choose to seek medical care at your doctor's office, retail clinic, urgent care center, or emergency room.  If you have a medical emergency, please immediately call 911 or go to the emergency department.  Pager Numbers  - Dr. Nehemiah Massed: [phone]  - Dr. Laurence Ferrari: [phone]  - Dr. Nicole Kindred: [phone]  In the event of inclement weather, please call our main line at [phone] for an update on the status of any delays or closures.  Dermatology Medication Tips: Please keep the boxes that topical medications come in in order to help keep track of the instructions about where and how to use these. Pharmacies typically print the medication instructions only on the boxes and not directly on the medication tubes.   If your medication is too expensive, please contact our office at [phone] option 4 or send [place] a  message through [place].   We are unable to tell what your co-pay for medications will be in advance as this is different depending on your insurance coverage. However, we may be able to find a substitute medication at lower cost or fill out paperwork to get insurance to cover a needed medication.   If a prior authorization is required to get your medication covered by your insurance company, please  allow [place] 1-2 business days to complete this process.  Drug prices often vary depending on where the prescription is filled and some pharmacies may offer cheaper prices.  The website www.goodrx.com contains coupons for medications through different pharmacies. The prices here do not account for what the cost may be with help from insurance (it may be cheaper with your insurance), but the website can give you the price if you did not use any insurance.  - You can print the associated coupon and take it with your prescription to the pharmacy.  - You may also stop by our office during regular business hours and pick up a GoodRx coupon card.  - If you need your prescription sent electronically to a different pharmacy, notify our office through [facility] or by phone at [phone] option 4.     Si Usted Necesita Algo Despus de Su Visita  Tambin puede enviarnos un mensaje a travs de Pharmacist, community. Por lo general respondemos a los mensajes de MyChart en el transcurso de 1 a 2 das hbiles.  Para renovar recetas, por favor pida a su farmacia que se ponga en contacto con nuestra oficina. Har[place] Dingwall de fax es [place] [phone].  Si tiene un asunto urgente cuando la clnica est cerrada y que no puede esperar hasta el siguiente da hbil, puede llamar/localizar a su doctor(a) al nmero que aparece a continuacin.   Por favor, tenga en cuenta que aunque hacemos todo lo posible para estar disponibles para asuntos urgentes fuera del horario de [place], no estamos disponibles las 24 horas del da, los 7 das de la [place].   Si tiene un problema urgente y no puede comunicarse con nosotros, puede optar por buscar atencin mdica  en el consultorio de su doctor(a), en una clnica privada, en un centro de atencin urgente o en una sala de emergencias.  Si tiene Engineering geologist, por favor llame inmediatamente al 911 o vaya a la sala de emergencias.  Nmeros de bper  - Dr. Nehemiah Massed:  [phone]  - Dra. Moye: [phone]  - Dra. Nicole Kindred: [phone]  En caso de inclemencias del [place], por favor llame a Johnsie Kindred principal al [phone] para una actualizacin sobre el [place] de cualquier retraso o cierre.  Consejos para la medicacin en dermatologa: Por favor, guarde las cajas en las que vienen los medicamentos de uso tpico para ayudarle a seguir las instrucciones sobre dnde y cmo usarlos. Las farmacias generalmente imprimen las instrucciones del medicamento slo en las cajas y no directamente en los tubos del [place].   Si su medicamento es muy caro, por favor, pngase en contacto con Zigmund Daniel llamando al [phone] y presione la opcin 4 o envenos un mensaje a travs de Pharmacist, community.   No podemos decirle cul ser su copago por los medicamentos por adelantado ya que esto es diferente dependiendo de la cobertura de su seguro. Sin embargo, es posible que podamos encontrar un medicamento sustituto a Electrical engineer un formulario para que el seguro cubra el medicamento que se considera necesario.   Si se requiere [employer]  autorizacin previa para que su compaa de seguros [place] su medicamento, por favor permtanos de 1 a 2 das hbiles para completar este proceso.  Los precios de los medicamentos varan con frecuencia dependiendo del Environmental consultant de dnde se surte la receta y alguna farmacias pueden ofrecer precios ms baratos.  El sitio web www.goodrx.com tiene cupones para medicamentos de Airline pilot. Los precios aqu no tienen en cuenta lo que podra costar con la ayuda del seguro (puede ser ms barato con su seguro), pero el sitio web puede darle el precio si no utiliz Research scientist (physical sciences).  - Puede imprimir el cupn correspondiente y llevarlo con su receta a la farmacia.  - Tambin puede pasar por nuestra oficina durante el horario de atencin regular y Charity fundraiser una tarjeta de cupones de GoodRx.  - Si necesita que su receta se enve electrnicamente a  una farmacia diferente, informe a nuestra oficina a travs de MyChart de [place] o por telfono llamando al [phone] y presione la opcin 4.

## 2021-03-25 NOTE — Progress Notes (Signed)
Follow-Up Visit   Subjective  Donna Jefferson is a 81 y.o. female who presents for the following: Actinic Keratosis (3 month recheck. AK's on left chest. ISK's right scalp and right forearm. Concerned with other lesions on arms). The patient has spots, moles and lesions to be evaluated, some may be new or changing and the patient has concerns that these could be cancer.  The following portions of the chart were reviewed this encounter and updated as appropriate:  Tobacco   Allergies   Meds   Problems   Med Hx   Surg Hx   Fam Hx      Review of Systems: No other skin or systemic complaints except as noted in HPI or Assessment and Plan.  Objective  Well appearing patient in no apparent distress; mood and affect are within normal limits.  A focused examination was performed including face, neck, scalp, arms. Relevant physical exam findings are noted in the Assessment and Plan.  Right scalp x2, right dorsal forearm x1 Erythematous keratotic or waxy stuck-on papule or plaque.  left chest Normal appearing skin on clinical exam  Right Upper Back No obvious rash on clinical exam  Right Chin Rhytides and volume loss.    Assessment & Plan   Actinic Damage - chronic, secondary to cumulative UV radiation exposure/sun exposure over time - diffuse scaly erythematous macules with underlying dyspigmentation - Recommend daily broad spectrum sunscreen SPF 30+ to sun-exposed areas, reapply every 2 hours as needed.  - Recommend staying in the shade or wearing long sleeves, sun glasses (UVA+UVB protection) and wide brim hats (4-inch brim around the entire circumference of the hat). - Call for new or changing lesions.  Inflamed seborrheic keratosis Right scalp x2, right dorsal forearm x1  Destruction of lesion - Right scalp x2, right dorsal forearm x1 Complexity: simple   Destruction method: cryotherapy   Informed consent: discussed and consent obtained   Timeout:  patient name, date of  birth, surgical site, and procedure verified Lesion destroyed using liquid nitrogen: Yes   Region frozen until ice ball extended beyond lesion: Yes   Outcome: patient tolerated procedure well with no complications   Post-procedure details: wound care instructions given    History of actinic keratoses left chest clear Observe for recurrence or new lesions.   Notalgia paresthetica Right Upper Back Notalgia paresthetica is a chronic condition affecting the skin of the back in which a pinched nerve along the spine causes itching or changes in sensation in an area of skin. This is usually accompanied by chronic rubbing or scratching. There is no cure, but there are some treatments which may help control the itch. Over the counter (non-prescription) treatments for notalgia paresthetica include numbing creams like pramoxine or lidocaine which temporarily reduce itch or Capsaicin-containing creams which cause a burning sensation but which sometimes over time will reset the nerves to stop producing itch.  If you choose to use Capsaicin cream, it is recommended to use it 5 times daily for 1 week followed by 3 times daily for 3-6 weeks. You may have to continue using it long-term. For severe cases, there are some prescription cream or pill options which may help.  Start Mometasone cream twice daily up to 2 weeks as needed for itching. Avoid applying to face, groin, and axilla. Use as directed. Long-term use can cause thinning of the skin.  mometasone (ELOCON) 0.1 % cream - Right Upper Back Apply twice daily to affected area on back as needed for itching.  Avoid applying to face, groin, and axilla. Use as directed. Long-term use can cause thinning of the skin.  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 Plan for Restylane defyne.  Return for TBSE As Scheduled.  I, Emelia Salisbury, CMA, am acting as scribe for Sarina Ser, MD. Documentation: I have reviewed the  above documentation for accuracy and completeness, and I agree with the above.  Sarina Ser, MD

## 2021-03-28 ENCOUNTER — Encounter: Payer: Self-pay | Admitting: Dermatology

## 2021-04-15 ENCOUNTER — Ambulatory Visit: Payer: Medicare PPO | Admitting: Dermatology

## 2021-06-12 ENCOUNTER — Ambulatory Visit (INDEPENDENT_AMBULATORY_CARE_PROVIDER_SITE_OTHER): Payer: Medicare PPO | Admitting: Dermatology

## 2021-06-12 DIAGNOSIS — L988 Other specified disorders of the skin and subcutaneous tissue: Secondary | ICD-10-CM

## 2021-06-12 DIAGNOSIS — L820 Inflamed seborrheic keratosis: Secondary | ICD-10-CM | POA: Diagnosis not present

## 2021-06-12 NOTE — Progress Notes (Signed)
   Follow-Up Visit   Subjective  Donna Jefferson is a 81 y.o. female who presents for the following: Facial Elastosis (Fillers today).  The following portions of the chart were reviewed this encounter and updated as appropriate:   Tobacco  Allergies  Meds  Problems  Med Hx  Surg Hx  Fam Hx     Review of Systems:  No other skin or systemic complaints except as noted in HPI or Assessment and Plan.  Objective  Well appearing patient in no apparent distress; mood and affect are within normal limits.  A focused examination was performed including face. Relevant physical exam findings are noted in the Assessment and Plan.  Face Rhytides and volume loss.                    Right scalp Erythematous stuck-on, waxy papule or plaque   Assessment & Plan  Elastosis of skin Face  Restylane Defyne to bilateral oral commissure/marionette lines today  Filling material injection - Face Prior to the procedure, the patient's past medical history, allergies and the rare but potential risks and complications were reviewed with the patient and a signed consent was obtained. Pre and post-treatment care was discussed and instructions provided.  Location: marionette lines  Filler Type: Restylane Defyne Lot #[id number] Exp 04/27/2022  Procedure: The area was prepped thoroughly with Puracyn. After introducing the needle into the desired treatment area, the syringe plunger was drawn back to ensure there was no flash of blood prior to injecting the filler in order to minimize risk of intravascular injection and vascular occlusion. After injection of the filler, the treated areas were cleansed and iced to reduce swelling. Post-treatment instructions were reviewed with the patient.       Patient tolerated the procedure well. The patient will call with any problems, questions or concerns prior to their next appointment.   Inflamed seborrheic keratosis Right scalp  Destruction of lesion  - Right scalp Complexity: simple   Destruction method: cryotherapy   Informed consent: discussed and consent obtained   Timeout:  patient name, date of birth, surgical site, and procedure verified Lesion destroyed using liquid nitrogen: Yes   Region frozen until ice ball extended beyond lesion: Yes   Outcome: patient tolerated procedure well with no complications   Post-procedure details: wound care instructions given     Return for Follow up as scheduled.  I, Ashok Cordia, CMA, am acting as scribe for Sarina Ser, MD . Documentation: I have reviewed the above documentation for accuracy and completeness, and I agree with the above.  Sarina Ser, MD

## 2021-06-12 NOTE — Patient Instructions (Signed)

## 2021-06-22 ENCOUNTER — Encounter: Payer: Self-pay | Admitting: Dermatology

## 2021-07-12 ENCOUNTER — Ambulatory Visit: Payer: Medicare PPO | Admitting: Student

## 2021-07-12 DIAGNOSIS — T8543XD Leakage of breast prosthesis and implant, subsequent encounter: Secondary | ICD-10-CM

## 2021-07-12 NOTE — Progress Notes (Signed)
   Referring Provider Derinda Late, MD [phone] [facility] [place],  [place] [id number]   CC:  Chief Complaint  Patient presents with   Follow-up      Donna Jefferson is an 81 y.o. female.  HPI:  Patient is an 81 year old female with history of bilateral silicone implant placement 40 years ago.  Patient found to have a left ruptured implant and unclear if the right implant was ruptured on the mammogram she had in 2019.  Patient's most recent mammogram was in August 2022.  This mammogram showed the left implant rupture.  There is no evidence of malignancy on this mammogram.  Patient was seen in the clinic by Dr. Marla Roe on 01/04/2021 for consult.  Plan was to give patient a quote for surgery to remove the implants bilaterally and replace them.  Planned to have patient follow-up in 1 year.  Today patient presents because she is having pain to the lateral side of her right breast.  She states the pain began 5 weeks ago.  She states that she believes it may have been a pulled muscle as she works out at [employer] often.  She presents today because she is unsure if her right implant has now ruptured.  She states her last mammogram was last August 2022.  Patient reports she is due for another mammogram in August 2023. She states that she thinks she wants to move forward with surgery as her Dr. Marla Roe have discussed at her consult, but she would like to know if her right implant has ruptured.  She denies any recent changes in her health.  She denies any fevers, chills or unexpected weight loss.  She denies any chest pain, shortness of breath or pain radiating down her arm.  She denies any masses or lumps to her right breast.  Review of Systems General: Denies fevers, chills, SOB, chest pain   Physical Exam    01/04/2021   11:20 AM 01/17/2018    9:59 AM 01/17/2018    9:54 AM  Vitals with BMI  Height '5\' 4"'$   '5\' 4"'$   Weight 158 lbs 6 oz  145 lbs   BMI 06.26  94.85  Systolic 462 703   Diastolic 71 71   Pulse 76 68     General:  No acute distress,  Alert and oriented, Non-Toxic, Normal speech and affect On exam, patient is sitting upright in no acute distress.  Breasts are symmetric and soft bilaterally.  There is no erythema, ecchymosis or swelling to either breast.  There are no masses palpated bilaterally.   Assessment/Plan Discussed with patient she should keep her yearly mammogram appointment and undergo her mammogram in August.  Discussed with patient that she will need a mammogram prior to moving forward with surgery if she decides to have surgery. Discussed with patient that she should follow-up with her PCP also for her pain. Patient acknowledged.    Instructed patient to give [place] a call if she has any questions or concerns.  Objective findings and plan discussed with Dr. Staci Acosta 07/12/2021, 2:51 PM

## 2021-07-15 ENCOUNTER — Other Ambulatory Visit: Payer: Self-pay | Admitting: Family Medicine

## 2021-07-23 ENCOUNTER — Telehealth: Payer: Self-pay | Admitting: Student

## 2021-07-24 ENCOUNTER — Other Ambulatory Visit: Payer: Self-pay | Admitting: Student

## 2021-07-24 DIAGNOSIS — N644 Mastodynia: Secondary | ICD-10-CM

## 2021-07-24 NOTE — Progress Notes (Signed)
Spoke with the patient on the phone regarding message in my chart.  Patient states she is still having some pain to the right breast.  She states that is intermittent.  She denies any changes since her visit in the office on 07/12/2021. She denies any new masses or lumps.  Discussed with patient that she can try Tylenol or ibuprofen to help with pain.  Discussed with patient that we can order an ultrasound for the right breast.  Also discussed with patient that she should keep her mammogram appointment for August.  Encouraged patient to also follow-up with her PCP.  Patient reports she is thinking she does want to move forward with surgery for implant exchange.   Instructed patient to call if she has any questions or concerns.

## 2021-07-25 ENCOUNTER — Telehealth: Payer: Self-pay | Admitting: Plastic Surgery

## 2021-07-25 NOTE — Telephone Encounter (Signed)
Outbound call placed to patient to schedule follow up appt with Dr. Marla Roe to discuss moving forward with surgery. Patient stated she's waiting for a call from a breast center for scheduling an appointment for imaging. Patient prefers to wait for those results and stated she will call back to reschedule.

## 2021-08-29 ENCOUNTER — Ambulatory Visit (INDEPENDENT_AMBULATORY_CARE_PROVIDER_SITE_OTHER): Payer: Medicare PPO | Admitting: Dermatology

## 2021-08-29 DIAGNOSIS — L820 Inflamed seborrheic keratosis: Secondary | ICD-10-CM

## 2021-08-29 DIAGNOSIS — L988 Other specified disorders of the skin and subcutaneous tissue: Secondary | ICD-10-CM

## 2021-08-29 DIAGNOSIS — L570 Actinic keratosis: Secondary | ICD-10-CM | POA: Diagnosis not present

## 2021-08-29 NOTE — Progress Notes (Signed)
Follow-Up Visit   Subjective  Donna Jefferson is a 81 y.o. 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 ) and Other (Patient reports a few spots at forehead and right lower leg she would like checked. ). The patient has spots, moles and lesions to be evaluated, some may be new or changing and the patient has concerns that these could be cancer.  The following portions of the chart were reviewed this encounter and updated as appropriate:  Tobacco  Allergies  Meds  Problems  Med Hx  Surg Hx  Fam Hx     Review of Systems: No other skin or systemic complaints except as noted in HPI or Assessment and Plan.  Objective  Well appearing patient in no apparent distress; mood and affect are within normal limits.  A focused examination was performed including mid face and chin. Relevant physical exam findings are noted in the Assessment and Plan.  mid face Rhytides and volume loss.                                                    left forehead x 1 Erythematous thin papules/macules with gritty scale.   right pretibial x 1, scalp x 1 (2) Erythematous stuck-on, waxy papule or plaque   Assessment & Plan  Elastosis of skin [name]; oral commissures and anterior chin crease  Filling material injection - marionette lines; oral commissures and anterior chin crease Prior to the procedure, the patient's past medical history, allergies and the rare but potential risks and complications were reviewed with the patient and a signed consent was obtained. Pre and post-treatment care was discussed and instructions provided.  Risks including vascular occlusion were discussed.   Location: See attached photo Filler Type: Restylane refyne  Procedure: The area was prepped thoroughly with Puracyn. After injection of the filler, the treated areas were cleansed and iced to reduce swelling.  Post-treatment instructions were reviewed with the patient.       Patient tolerated the procedure well. The patient will call with any problems, questions or concerns prior to their next appointment.  Restylane Refyne  Ref No : [id number] Lot No : [id number] Exp 2022-05-27  Actinic keratosis left forehead x 1 Actinic keratoses are precancerous spots that appear secondary to cumulative UV radiation exposure/sun exposure over time. They are chronic with expected duration over 1 year. A portion of actinic keratoses will progress to squamous cell carcinoma of the skin. It is not possible to reliably predict which spots will progress to skin cancer and so treatment is recommended to prevent development of skin cancer.  Recommend daily broad spectrum sunscreen SPF 30+ to sun-exposed areas, reapply every 2 hours as needed.  Recommend staying in the shade or wearing long sleeves, sun glasses (UVA+UVB protection) and wide brim hats (4-inch brim around the entire circumference of the hat). Call for new or changing lesions.  Destruction of lesion - left forehead x 1 Complexity: simple   Destruction method: cryotherapy   Informed consent: discussed and consent obtained   Timeout:  patient name, date of birth, surgical site, and procedure verified Lesion destroyed using liquid nitrogen: Yes   Region frozen until ice ball extended beyond lesion: Yes   Outcome: patient tolerated procedure well with no complications   Post-procedure details: wound care  instructions given   Additional details:  Prior to procedure, discussed risks of blister formation, small wound, skin dyspigmentation, or rare scar following cryotherapy. Recommend Vaseline ointment to treated areas while healing.  Inflamed seborrheic keratosis (2) right pretibial x 1, scalp x 1 Symptomatic, irritating, patient would like treated. Destruction of lesion - right pretibial x 1, scalp x 1 Complexity: simple   Destruction method: cryotherapy    Informed consent: discussed and consent obtained   Timeout:  patient name, date of birth, surgical site, and procedure verified Lesion destroyed using liquid nitrogen: Yes   Region frozen until ice ball extended beyond lesion: Yes   Outcome: patient tolerated procedure well with no complications   Post-procedure details: wound care instructions given   Additional details:  Prior to procedure, discussed risks of blister formation, small wound, skin dyspigmentation, or rare scar following cryotherapy. Recommend Vaseline ointment to treated areas while healing.  Return in about 6 months (around 03/01/2022) for filler .  IRuthell Rummage, CMA, am acting as scribe for Sarina Ser, MD. Documentation: I have reviewed the above documentation for accuracy and completeness, and I agree with the above.  Sarina Ser, MD

## 2021-08-29 NOTE — Patient Instructions (Addendum)
Due to recent changes in healthcare laws, you may see results of your pathology and/or laboratory studies on MyChart before the doctors have had a chance to review them. We understand that in some cases there may be results that are confusing or concerning to you. Please understand that not all results are received at the same time and often the doctors may need to interpret multiple results in order to provide you with the best plan of care or course of treatment. Therefore, we ask that you please give us 2 business days to thoroughly review all your results before contacting the office for clarification. Should we see a critical lab result, you will be contacted sooner.   If You Need Anything After Your Visit  If you have any questions or concerns for your doctor, please call our main line at [phone] and press option 4 to reach your doctor's medical assistant. If no one answers, please leave a voicemail as directed and we will return your call as soon as possible. Messages left after 4 pm will be answered the following business day.   You may also send us a message via MyChart. We typically respond to MyChart messages within 1-2 business days.  For prescription refills, please ask your pharmacy to contact our office. Our fax number is [phone].  If you have an urgent issue when the clinic is closed that cannot wait until the next business day, you can page your doctor at the number below.    Please note that while we do our best to be available for urgent issues outside of office hours, we are not available 24/7.   If you have an urgent issue and are unable to reach us, you may choose to seek medical care at your doctor's office, retail clinic, urgent care center, or emergency room.  If you have a medical emergency, please immediately call 911 or go to the emergency department.  Pager Numbers  - Dr. Kowalski: [phone]  - Dr. Moye: [phone]  - Dr. Stewart:  [phone]  In the event of inclement weather, please call our main line at [phone] for an update on the status of any delays or closures.  Dermatology Medication Tips: Please keep the boxes that topical medications come in in order to help keep track of the instructions about where and how to use these. Pharmacies typically print the medication instructions only on the boxes and not directly on the medication tubes.   If your medication is too expensive, please contact our office at [phone] option 4 or send us a message through MyChart.   We are unable to tell what your co-pay for medications will be in advance as this is different depending on your insurance coverage. However, we may be able to find a substitute medication at lower cost or fill out paperwork to get insurance to cover a needed medication.   If a prior authorization is required to get your medication covered by your insurance company, please allow us 1-2 business days to complete this process.  Drug prices often vary depending on where the prescription is filled and some pharmacies may offer cheaper prices.  The website www.goodrx.com contains coupons for medications through different pharmacies. The prices here do not account for what the cost may be with help from insurance (it may be cheaper with your insurance), but the website can give you the price if you did not use any insurance.  - You can print the associated coupon and take it with   your prescription to the pharmacy.  - You may also stop by our office during regular business hours and pick up a GoodRx coupon card.  - If you need your prescription sent electronically to a different pharmacy, notify our office through [facility] MyChart or by phone at [phone] option 4.     Si Usted Necesita Algo Despus de Su Visita  Tambin puede enviarnos un mensaje a travs de MyChart. Por lo general respondemos a los mensajes de MyChart en el transcurso de 1 a 2  das hbiles.  Para renovar recetas, por favor pida a su farmacia que se ponga en contacto con nuestra oficina. Nuestro nmero de fax es el [phone].  Si tiene un asunto urgente cuando la clnica est cerrada y que no puede esperar hasta el siguiente da hbil, puede llamar/localizar a su doctor(a) al nmero que aparece a continuacin.   Por favor, tenga en cuenta que aunque hacemos todo lo posible para estar disponibles para asuntos urgentes fuera del horario de oficina, no estamos disponibles las 24 horas del da, los 7 das de la semana.   Si tiene un problema urgente y no puede comunicarse con nosotros, puede optar por buscar atencin mdica  en el consultorio de su doctor(a), en una clnica privada, en un centro de atencin urgente o en una sala de emergencias.  Si tiene una emergencia mdica, por favor llame inmediatamente al 911 o vaya a la sala de emergencias.  Nmeros de bper  - Dr. Kowalski: [phone]  - Dra. Moye: [phone]  - Dra. Stewart: [phone]  En caso de inclemencias del tiempo, por favor llame a nuestra lnea principal al [phone] para una actualizacin sobre el estado de cualquier retraso o cierre.  Consejos para la medicacin en dermatologa: Por favor, guarde las cajas en las que vienen los medicamentos de uso tpico para ayudarle a seguir las instrucciones sobre dnde y cmo usarlos. Las farmacias generalmente imprimen las instrucciones del medicamento slo en las cajas y no directamente en los tubos del medicamento.   Si su medicamento es muy caro, por favor, pngase en contacto con nuestra oficina llamando al [phone] y presione la opcin 4 o envenos un mensaje a travs de MyChart.   No podemos decirle cul ser su copago por los medicamentos por adelantado ya que esto es diferente dependiendo de la cobertura de su seguro. Sin embargo, es posible que podamos encontrar un medicamento sustituto a menor costo o llenar un formulario para que el  seguro cubra el medicamento que se considera necesario.   Si se requiere una autorizacin previa para que su compaa de seguros cubra su medicamento, por favor permtanos de 1 a 2 das hbiles para completar este proceso.  Los precios de los medicamentos varan con frecuencia dependiendo del lugar de dnde se surte la receta y alguna farmacias pueden ofrecer precios ms baratos.  El sitio web www.goodrx.com tiene cupones para medicamentos de diferentes farmacias. Los precios aqu no tienen en cuenta lo que podra costar con la ayuda del seguro (puede ser ms barato con su seguro), pero el sitio web puede darle el precio si no utiliz ningn seguro.  - Puede imprimir el cupn correspondiente y llevarlo con su receta a la farmacia.  - Tambin puede pasar por nuestra oficina durante el horario de atencin regular y recoger una tarjeta de cupones de GoodRx.  - Si necesita que su receta se enve electrnicamente a una farmacia diferente, informe a nuestra oficina a travs de MyChart de [facility]   o por telfono llamando al [phone] y presione la opcin 4.  

## 2021-09-01 ENCOUNTER — Encounter: Payer: Self-pay | Admitting: Dermatology

## 2021-09-09 ENCOUNTER — Ambulatory Visit
Admission: RE | Admit: 2021-09-09 | Discharge: 2021-09-09 | Disposition: A | Discharge status: Home / Self Care | Payer: Medicare PPO | Source: Ambulatory Visit | Attending: Student | Admitting: Student

## 2021-09-09 ENCOUNTER — Other Ambulatory Visit: Payer: Self-pay | Admitting: Student

## 2021-09-09 DIAGNOSIS — N644 Mastodynia: Secondary | ICD-10-CM

## 2021-09-24 ENCOUNTER — Telehealth: Payer: Medicare PPO | Admitting: Plastic Surgery

## 2021-09-24 NOTE — Progress Notes (Incomplete)
   Subjective:    Patient ID: Donna Jefferson, female    DOB: 1940/02/25, 81 y.o.   MRN: [id number]  HPI The patient is an 81 yrs old female joining me by phone for further discussion about her implants.  She had them placed 40 years ago by Dr. Wendy Poet for a breast augmentation.  When the patient had her 2019 mammogram it was noted that her left implant had ruptured.  It was not clear whether or not the right had ruptured as well.  She is in good health. She has grade 2 ptosis of her breasts.  She had a mammogram in August 2022 and it was unchanged from her previous mammogram.   Review of Systems     Objective:   Physical Exam        Assessment & Plan:

## 2021-12-11 ENCOUNTER — Ambulatory Visit: Payer: Medicare PPO | Admitting: Dermatology

## 2021-12-11 ENCOUNTER — Encounter: Payer: Self-pay | Admitting: Dermatology

## 2021-12-11 DIAGNOSIS — Z1283 Encounter for screening for malignant neoplasm of skin: Secondary | ICD-10-CM | POA: Diagnosis not present

## 2021-12-11 DIAGNOSIS — L578 Other skin changes due to chronic exposure to nonionizing radiation: Secondary | ICD-10-CM | POA: Diagnosis not present

## 2021-12-11 DIAGNOSIS — D1801 Hemangioma of skin and subcutaneous tissue: Secondary | ICD-10-CM

## 2021-12-11 DIAGNOSIS — L820 Inflamed seborrheic keratosis: Secondary | ICD-10-CM | POA: Diagnosis not present

## 2021-12-11 DIAGNOSIS — Z86007 Personal history of in-situ neoplasm of skin: Secondary | ICD-10-CM

## 2021-12-11 DIAGNOSIS — Z8589 Personal history of malignant neoplasm of other organs and systems: Secondary | ICD-10-CM

## 2021-12-11 DIAGNOSIS — D229 Melanocytic nevi, unspecified: Secondary | ICD-10-CM

## 2021-12-11 DIAGNOSIS — L918 Other hypertrophic disorders of the skin: Secondary | ICD-10-CM

## 2021-12-11 DIAGNOSIS — Z85828 Personal history of other malignant neoplasm of skin: Secondary | ICD-10-CM | POA: Diagnosis not present

## 2021-12-11 DIAGNOSIS — D692 Other nonthrombocytopenic purpura: Secondary | ICD-10-CM

## 2021-12-11 DIAGNOSIS — L814 Other melanin hyperpigmentation: Secondary | ICD-10-CM

## 2021-12-11 DIAGNOSIS — L821 Other seborrheic keratosis: Secondary | ICD-10-CM

## 2021-12-11 NOTE — Patient Instructions (Addendum)
Cryotherapy Aftercare  Wash gently with soap and water everyday.   Apply Vaseline and Band-Aid daily until healed.     Due to recent changes in healthcare laws, you may see results of your pathology and/or laboratory studies on MyChart before the doctors have had a chance to review them. We understand that in some cases there may be results that are confusing or concerning to you. Please understand that not all results are received at the same time and often the doctors may need to interpret multiple results in order to provide you with the best plan of care or course of treatment. Therefore, we ask that you please give us 2 business days to thoroughly review all your results before contacting the office for clarification. Should we see a critical lab result, you will be contacted sooner.   If You Need Anything After Your Visit  If you have any questions or concerns for your doctor, please call our main line at [phone] and press option 4 to reach your doctor's medical assistant. If no one answers, please leave a voicemail as directed and we will return your call as soon as possible. Messages left after 4 pm will be answered the following business day.   You may also send us a message via MyChart. We typically respond to MyChart messages within 1-2 business days.  For prescription refills, please ask your pharmacy to contact our office. Our fax number is [phone].  If you have an urgent issue when the clinic is closed that cannot wait until the next business day, you can page your doctor at the number below.    Please note that while we do our best to be available for urgent issues outside of office hours, we are not available 24/7.   If you have an urgent issue and are unable to reach us, you may choose to seek medical care at your doctor's office, retail clinic, urgent care center, or emergency room.  If you have a medical emergency, please immediately call 911 or go to the  emergency department.  Pager Numbers  - Dr. Kowalski: [phone]  - Dr. Moye: [phone]  - Dr. Stewart: [phone]  In the event of inclement weather, please call our main line at [phone] for an update on the status of any delays or closures.  Dermatology Medication Tips: Please keep the boxes that topical medications come in in order to help keep track of the instructions about where and how to use these. Pharmacies typically print the medication instructions only on the boxes and not directly on the medication tubes.   If your medication is too expensive, please contact our office at [phone] option 4 or send us a message through MyChart.   We are unable to tell what your co-pay for medications will be in advance as this is different depending on your insurance coverage. However, we may be able to find a substitute medication at lower cost or fill out paperwork to get insurance to cover a needed medication.   If a prior authorization is required to get your medication covered by your insurance company, please allow us 1-2 business days to complete this process.  Drug prices often vary depending on where the prescription is filled and some pharmacies may offer cheaper prices.  The website www.goodrx.com contains coupons for medications through different pharmacies. The prices here do not account for what the cost may be with help from insurance (it may be cheaper with your insurance), but the website can   give you the price if you did not use any insurance.  - You can print the associated coupon and take it with your prescription to the pharmacy.  - You may also stop by our office during regular business hours and pick up a GoodRx coupon card.  - If you need your prescription sent electronically to a different pharmacy, notify our office through [facility] MyChart or by phone at [phone] option 4.     Si Usted Necesita Algo Despus de Su Visita  Tambin puede  enviarnos un mensaje a travs de MyChart. Por lo general respondemos a los mensajes de MyChart en el transcurso de 1 a 2 das hbiles.  Para renovar recetas, por favor pida a su farmacia que se ponga en contacto con nuestra oficina. Nuestro nmero de fax es el [phone].  Si tiene un asunto urgente cuando la clnica est cerrada y que no puede esperar hasta el siguiente da hbil, puede llamar/localizar a su doctor(a) al nmero que aparece a continuacin.   Por favor, tenga en cuenta que aunque hacemos todo lo posible para estar disponibles para asuntos urgentes fuera del horario de oficina, no estamos disponibles las 24 horas del da, los 7 das de la semana.   Si tiene un problema urgente y no puede comunicarse con nosotros, puede optar por buscar atencin mdica  en el consultorio de su doctor(a), en una clnica privada, en un centro de atencin urgente o en una sala de emergencias.  Si tiene una emergencia mdica, por favor llame inmediatamente al 911 o vaya a la sala de emergencias.  Nmeros de bper  - Dr. Kowalski: [phone]  - Dra. Moye: [phone]  - Dra. Stewart: [phone]  En caso de inclemencias del tiempo, por favor llame a nuestra lnea principal al [phone] para una actualizacin sobre el estado de cualquier retraso o cierre.  Consejos para la medicacin en dermatologa: Por favor, guarde las cajas en las que vienen los medicamentos de uso tpico para ayudarle a seguir las instrucciones sobre dnde y cmo usarlos. Las farmacias generalmente imprimen las instrucciones del medicamento slo en las cajas y no directamente en los tubos del medicamento.   Si su medicamento es muy caro, por favor, pngase en contacto con nuestra oficina llamando al [phone] y presione la opcin 4 o envenos un mensaje a travs de MyChart.   No podemos decirle cul ser su copago por los medicamentos por adelantado ya que esto es diferente dependiendo de la cobertura de su seguro.  Sin embargo, es posible que podamos encontrar un medicamento sustituto a menor costo o llenar un formulario para que el seguro cubra el medicamento que se considera necesario.   Si se requiere una autorizacin previa para que su compaa de seguros cubra su medicamento, por favor permtanos de 1 a 2 das hbiles para completar este proceso.  Los precios de los medicamentos varan con frecuencia dependiendo del lugar de dnde se surte la receta y alguna farmacias pueden ofrecer precios ms baratos.  El sitio web www.goodrx.com tiene cupones para medicamentos de diferentes farmacias. Los precios aqu no tienen en cuenta lo que podra costar con la ayuda del seguro (puede ser ms barato con su seguro), pero el sitio web puede darle el precio si no utiliz ningn seguro.  - Puede imprimir el cupn correspondiente y llevarlo con su receta a la farmacia.  - Tambin puede pasar por nuestra oficina durante el horario de atencin regular y recoger una tarjeta de cupones de GoodRx.  -   Si necesita que su receta se enve electrnicamente a una farmacia diferente, informe a nuestra oficina a travs de MyChart de [facility] o por telfono llamando al [phone] y presione la opcin 4.  

## 2021-12-11 NOTE — Progress Notes (Signed)
Follow-Up Visit   Subjective  Donna Jefferson is a 81 y.o. female who presents for the following: Total body skin exam (Hx of SCC, SCC IS, AKs), ISK (Scalp, txted with LN2 in past), and growths (R axilla, irritating). The patient has spots, moles and lesions to be evaluated, some may be new or changing and the patient has concerns that these could be cancer.  The following portions of the chart were reviewed this encounter and updated as appropriate:   Tobacco  Allergies  Meds  Problems  Med Hx  Surg Hx  Fam Hx     Review of Systems:  No other skin or systemic complaints except as noted in HPI or Assessment and Plan.  Objective  Well appearing patient in no apparent distress; mood and affect are within normal limits.  A full examination was performed including scalp, head, eyes, ears, nose, lips, neck, chest, axillae, abdomen, back, buttocks, bilateral upper extremities, bilateral lower extremities, hands, feet, fingers, toes, fingernails, and toenails. All findings within normal limits unless otherwise noted below.  R scalp x 2 (2) Stuck on waxy paps with erythema   Assessment & Plan   History of Squamous Cell Carcinoma of the Skin - No evidence of recurrence today - No lymphadenopathy - Recommend regular full body skin exams - Recommend daily broad spectrum sunscreen SPF 30+ to sun-exposed areas, reapply every 2 hours as needed.  - Call if any new or changing lesions are noted between office visits -L distal pretibial  History of Squamous Cell Carcinoma in Situ of the Skin - No evidence of recurrence today - Recommend regular full body skin exams - Recommend daily broad spectrum sunscreen SPF 30+ to sun-exposed areas, reapply every 2 hours as needed.  - Call if any new or changing lesions are noted between office visits  - L paraspinal, L medial infraclavicular  Lentigines - Scattered tan macules - Due to sun exposure - Benign-appearing, observe - Recommend  daily broad spectrum sunscreen SPF 30+ to sun-exposed areas, reapply every 2 hours as needed. - Call for any changes - back  Seborrheic Keratoses - Stuck-on, waxy, tan-brown papules and/or plaques  - Benign-appearing - Discussed benign etiology and prognosis. - Observe - Call for any changes - arms, trunk  Melanocytic Nevi - Tan-brown and/or pink-flesh-colored symmetric macules and papules - Benign appearing on exam today - Observation - Call clinic for new or changing moles - Recommend daily use of broad spectrum spf 30+ sunscreen to sun-exposed areas.  - trunk  Hemangiomas - Red papules - Discussed benign nature - Observe - Call for any changes - trunk  Actinic Damage - Chronic condition, secondary to cumulative UV/sun exposure - diffuse scaly erythematous macules with underlying dyspigmentation - Recommend daily broad spectrum sunscreen SPF 30+ to sun-exposed areas, reapply every 2 hours as needed.  - Staying in the shade or wearing long sleeves, sun glasses (UVA+UVB protection) and wide brim hats (4-inch brim around the entire circumference of the hat) are also recommended for sun protection.  - Call for new or changing lesions.  Skin cancer screening performed today.   Inflamed seborrheic keratosis (2) R scalp x 2  Symptomatic, irritating, patient would like treated.   Destruction of lesion - R scalp x 2 Complexity: simple   Destruction method: cryotherapy   Informed consent: discussed and consent obtained   Timeout:  patient name, date of birth, surgical site, and procedure verified Lesion destroyed using liquid nitrogen: Yes   Region frozen until  ice ball extended beyond lesion: Yes   Outcome: patient tolerated procedure well with no complications   Post-procedure details: wound care instructions given     Milia - tiny firm white papules - type of cyst - benign - may be extracted if symptomatic - observe  - post scalp  Purpura - Chronic; persistent  and recurrent.  Treatable, but not curable. - Violaceous macules and patches - Benign - Related to trauma, age, sun damage and/or use of blood thinners, chronic use of topical and/or oral steroids - Observe - Can use OTC arnica containing moisturizer such as Dermend Bruise Formula if desired - Call for worsening or other concerns   Acrochordons (Skin Tags) - Fleshy, skin-colored pedunculated papules - Benign appearing.  - Observe. - If desired, they can be removed with an in office procedure that is not covered by insurance. - Please call the clinic if you notice any new or changing lesions.   Return in about 1 year (around 12/12/2022) for TBSE, Hx of SCC, hx of SCC IS, Hx of AKs.  I, Othelia Pulling, RMA, am acting as scribe for Sarina Ser, MD . Documentation: I have reviewed the above documentation for accuracy and completeness, and I agree with the above.  Sarina Ser, MD

## 2021-12-26 ENCOUNTER — Encounter: Payer: Self-pay | Admitting: Dermatology

## 2022-01-07 ENCOUNTER — Encounter: Payer: Self-pay | Admitting: Plastic Surgery

## 2022-01-07 ENCOUNTER — Ambulatory Visit: Payer: Medicare PPO | Admitting: Plastic Surgery

## 2022-01-07 VITALS — BP 177/70 | HR 65

## 2022-01-07 DIAGNOSIS — T8543XA Leakage of breast prosthesis and implant, initial encounter: Secondary | ICD-10-CM

## 2022-01-07 DIAGNOSIS — T8543XD Leakage of breast prosthesis and implant, subsequent encounter: Secondary | ICD-10-CM | POA: Diagnosis not present

## 2022-01-07 NOTE — Progress Notes (Signed)
   Subjective:    Patient ID: Donna Jefferson, female    DOB: 1940-10-04, 81 y.o.   MRN: [id number]  The patient is a lovely 81 year old female here for a 1 year follow-up on her implants.  41 years ago she had silicone implants placed bilaterally by Dr. Wendy Poet.  They were placed for cosmetic reasons and she does not have a history of breast cancer.  The patient was having some pain in her breasts which led to a mammogram in 2019.  She then had a mammogram in August 2022 which did not show any issues or significant changes.  She does not have any lumps or bumps noted on exam.  She now states that the pain has resolved and she has not had any in the last several months.  She does have grade 2 ptosis but looks really good especially considering her age.  She does not want to have implants removed or replaced if she does not have to.  She has had some issues with her blood pressure being quite high and her primary doc is working on medications to improve her blood pressure.    Review of Systems  Constitutional: Negative.   HENT: Negative.    Eyes: Negative.   Respiratory: Negative.    Cardiovascular: Negative.  Negative for leg swelling.  Gastrointestinal: Negative.   Endocrine: Negative.   Genitourinary: Negative.   Musculoskeletal: Negative.        Objective:   Physical Exam Vitals reviewed.  Constitutional:      Appearance: Normal appearance.  HENT:     Head: Normocephalic.  Cardiovascular:     Rate and Rhythm: Normal rate.     Pulses: Normal pulses.  Pulmonary:     Effort: Pulmonary effort is normal.  Abdominal:     General: There is no distension.     Palpations: Abdomen is soft.     Tenderness: There is no abdominal tenderness.  Skin:    General: Skin is warm.     Coloration: Skin is not jaundiced.     Findings: No bruising or lesion.  Neurological:     Mental Status: She is alert and oriented to person, place, and time.  Psychiatric:        Behavior: Behavior normal.         Thought Content: Thought content normal.        Judgment: Judgment normal.        Assessment & Plan:     ICD-10-CM   1. Rupture of implant of left breast, initial encounter  T85.43XA       Pictures were obtained of the patient and placed in the chart with the patient's or guardian's permission. The patient is okay to wait and give it another year.  If she remains asymptomatic she can continue to watch it.  I think the big question is whether or not it will need to be removed in the future and whether or not her health will allow for this.  But now with her blood pressure high now is likely not the time.  The patient is content with leaving the men for now and following up in 1 year.

## 2022-03-06 ENCOUNTER — Encounter: Payer: Self-pay | Admitting: Dermatology

## 2022-03-06 ENCOUNTER — Ambulatory Visit (INDEPENDENT_AMBULATORY_CARE_PROVIDER_SITE_OTHER): Payer: Medicare PPO | Admitting: Dermatology

## 2022-03-06 VITALS — BP 141/73 | HR 69

## 2022-03-06 DIAGNOSIS — L988 Other specified disorders of the skin and subcutaneous tissue: Secondary | ICD-10-CM

## 2022-03-06 DIAGNOSIS — L570 Actinic keratosis: Secondary | ICD-10-CM | POA: Diagnosis not present

## 2022-03-06 DIAGNOSIS — L578 Other skin changes due to chronic exposure to nonionizing radiation: Secondary | ICD-10-CM | POA: Diagnosis not present

## 2022-03-06 NOTE — Progress Notes (Signed)
Follow-Up Visit   Subjective  Donna Jefferson is a 82 y.o. 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 The patient has spots, moles and lesions to be evaluated, some may be new or changing and the patient has concerns that these could be cancer.  The following portions of the chart were reviewed this encounter and updated as appropriate:  Tobacco  Allergies  Meds  Problems  Med Hx  Surg Hx  Fam Hx     Review of Systems: No other skin or systemic complaints except as noted in HPI or Assessment and Plan.  Objective  Well appearing patient in no apparent distress; mood and affect are within normal limits.  A focused examination was performed including face . Relevant physical exam findings are noted in the Assessment and Plan.  Head - Anterior (Face) Rhytides and volume loss.                                                right upper lip x 1, left upper lip x 1 (2) Erythematous thin papules/macules with gritty scale.    Assessment & Plan  Elastosis of skin Head - Anterior (Face) Restylane Refyne Filler injected today into [name], oral commissures, anterior chin crease Lot [id number] Exp 2023-02-27  Filling material injection - Head - Anterior (Face) Prior to the procedure, the patient's past medical history, allergies and the rare but potential risks and complications were reviewed with the patient and a signed consent was obtained. Pre and post-treatment care was discussed and instructions provided.  Risks including vascular occlusion were discussed.   Location: See attached photo Filler Type: Restylane refyne  Procedure: The area was prepped thoroughly with Puracyn. After introducing the needle into the desired treatment area, the syringe plunger was drawn back to ensure there was no flash of blood prior to injecting the filler in order to minimize risk of intravascular  injection and vascular occlusion. After injection of the filler, the treated areas were cleansed and iced to reduce swelling. Post-treatment instructions were reviewed with the patient.       Patient tolerated the procedure well. The patient will call with any problems, questions or concerns prior to their next appointment.  Lot # [id number] 2023-02-27  Actinic keratosis (2) right upper lip x 1, left upper lip x 1 Actinic keratoses are precancerous spots that appear secondary to cumulative UV radiation exposure/sun exposure over time. They are chronic with expected duration over 1 year. A portion of actinic keratoses will progress to squamous cell carcinoma of the skin. It is not possible to reliably predict which spots will progress to skin cancer and so treatment is recommended to prevent development of skin cancer.  Recommend daily broad spectrum sunscreen SPF 30+ to sun-exposed areas, reapply every 2 hours as needed.  Recommend staying in the shade or wearing long sleeves, sun glasses (UVA+UVB protection) and wide brim hats (4-inch brim around the entire circumference of the hat). Call for new or changing lesions.  Destruction of lesion - right upper lip x 1, left upper lip x 1 Complexity: simple   Destruction method: cryotherapy   Informed consent: discussed and consent obtained   Timeout:  patient name, date of birth, surgical site, and procedure verified Lesion destroyed using liquid nitrogen: Yes   Region frozen until  ice ball extended beyond lesion: Yes   Outcome: patient tolerated procedure well with no complications   Post-procedure details: wound care instructions given   Additional details:  Prior to procedure, discussed risks of blister formation, small wound, skin dyspigmentation, or rare scar following cryotherapy. Recommend Vaseline ointment to treated areas while healing.  Return in about 6 months (around 09/04/2022) for filler .  IRuthell Rummage, CMA, am acting as scribe  for Sarina Ser, MD. Documentation: I have reviewed the above documentation for accuracy and completeness, and I agree with the above.  Sarina Ser, MD

## 2022-03-06 NOTE — Patient Instructions (Addendum)
Due to recent changes in healthcare laws, you may see results of your pathology and/or laboratory studies on MyChart before the doctors have had a chance to review them. We understand that in some cases there may be results that are confusing or concerning to you. Please understand that not all results are received at the same time and often the doctors may need to interpret multiple results in order to provide you with the best plan of care or course of treatment. Therefore, we ask that you please give us 2 business days to thoroughly review all your results before contacting the office for clarification. Should we see a critical lab result, you will be contacted sooner.   If You Need Anything After Your Visit  If you have any questions or concerns for your doctor, please call our main line at [phone] and press option 4 to reach your doctor's medical assistant. If no one answers, please leave a voicemail as directed and we will return your call as soon as possible. Messages left after 4 pm will be answered the following business day.   You may also send us a message via MyChart. We typically respond to MyChart messages within 1-2 business days.  For prescription refills, please ask your pharmacy to contact our office. Our fax number is [phone].  If you have an urgent issue when the clinic is closed that cannot wait until the next business day, you can page your doctor at the number below.    Please note that while we do our best to be available for urgent issues outside of office hours, we are not available 24/7.   If you have an urgent issue and are unable to reach us, you may choose to seek medical care at your doctor's office, retail clinic, urgent care center, or emergency room.  If you have a medical emergency, please immediately call 911 or go to the emergency department.  Pager Numbers  - Dr. Kowalski: [phone]  - Dr. Moye: [phone]  - Dr. Stewart:  [phone]  In the event of inclement weather, please call our main line at [phone] for an update on the status of any delays or closures.  Dermatology Medication Tips: Please keep the boxes that topical medications come in in order to help keep track of the instructions about where and how to use these. Pharmacies typically print the medication instructions only on the boxes and not directly on the medication tubes.   If your medication is too expensive, please contact our office at [phone] option 4 or send us a message through MyChart.   We are unable to tell what your co-pay for medications will be in advance as this is different depending on your insurance coverage. However, we may be able to find a substitute medication at lower cost or fill out paperwork to get insurance to cover a needed medication.   If a prior authorization is required to get your medication covered by your insurance company, please allow us 1-2 business days to complete this process.  Drug prices often vary depending on where the prescription is filled and some pharmacies may offer cheaper prices.  The website www.goodrx.com contains coupons for medications through different pharmacies. The prices here do not account for what the cost may be with help from insurance (it may be cheaper with your insurance), but the website can give you the price if you did not use any insurance.  - You can print the associated coupon and take it with   your prescription to the pharmacy.  - You may also stop by our office during regular business hours and pick up a GoodRx coupon card.  - If you need your prescription sent electronically to a different pharmacy, notify our office through [facility] MyChart or by phone at [phone] option 4.     Si Usted Necesita Algo Despus de Su Visita  Tambin puede enviarnos un mensaje a travs de MyChart. Por lo general respondemos a los mensajes de MyChart en el transcurso de 1 a 2  das hbiles.  Para renovar recetas, por favor pida a su farmacia que se ponga en contacto con nuestra oficina. Nuestro nmero de fax es el [phone].  Si tiene un asunto urgente cuando la clnica est cerrada y que no puede esperar hasta el siguiente da hbil, puede llamar/localizar a su doctor(a) al nmero que aparece a continuacin.   Por favor, tenga en cuenta que aunque hacemos todo lo posible para estar disponibles para asuntos urgentes fuera del horario de oficina, no estamos disponibles las 24 horas del da, los 7 das de la semana.   Si tiene un problema urgente y no puede comunicarse con nosotros, puede optar por buscar atencin mdica  en el consultorio de su doctor(a), en una clnica privada, en un centro de atencin urgente o en una sala de emergencias.  Si tiene una emergencia mdica, por favor llame inmediatamente al 911 o vaya a la sala de emergencias.  Nmeros de bper  - Dr. Kowalski: [phone]  - Dra. Moye: [phone]  - Dra. Stewart: [phone]  En caso de inclemencias del tiempo, por favor llame a nuestra lnea principal al [phone] para una actualizacin sobre el estado de cualquier retraso o cierre.  Consejos para la medicacin en dermatologa: Por favor, guarde las cajas en las que vienen los medicamentos de uso tpico para ayudarle a seguir las instrucciones sobre dnde y cmo usarlos. Las farmacias generalmente imprimen las instrucciones del medicamento slo en las cajas y no directamente en los tubos del medicamento.   Si su medicamento es muy caro, por favor, pngase en contacto con nuestra oficina llamando al [phone] y presione la opcin 4 o envenos un mensaje a travs de MyChart.   No podemos decirle cul ser su copago por los medicamentos por adelantado ya que esto es diferente dependiendo de la cobertura de su seguro. Sin embargo, es posible que podamos encontrar un medicamento sustituto a menor costo o llenar un formulario para que el  seguro cubra el medicamento que se considera necesario.   Si se requiere una autorizacin previa para que su compaa de seguros cubra su medicamento, por favor permtanos de 1 a 2 das hbiles para completar este proceso.  Los precios de los medicamentos varan con frecuencia dependiendo del lugar de dnde se surte la receta y alguna farmacias pueden ofrecer precios ms baratos.  El sitio web www.goodrx.com tiene cupones para medicamentos de diferentes farmacias. Los precios aqu no tienen en cuenta lo que podra costar con la ayuda del seguro (puede ser ms barato con su seguro), pero el sitio web puede darle el precio si no utiliz ningn seguro.  - Puede imprimir el cupn correspondiente y llevarlo con su receta a la farmacia.  - Tambin puede pasar por nuestra oficina durante el horario de atencin regular y recoger una tarjeta de cupones de GoodRx.  - Si necesita que su receta se enve electrnicamente a una farmacia diferente, informe a nuestra oficina a travs de MyChart de [facility]   o por telfono llamando al [phone] y presione la opcin 4.  

## 2022-03-18 ENCOUNTER — Encounter: Payer: Self-pay | Admitting: Dermatology

## 2022-07-28 ENCOUNTER — Other Ambulatory Visit: Payer: Self-pay | Admitting: Family Medicine

## 2022-07-28 DIAGNOSIS — Z1231 Encounter for screening mammogram for malignant neoplasm of breast: Secondary | ICD-10-CM

## 2022-09-15 ENCOUNTER — Ambulatory Visit
Admission: RE | Admit: 2022-09-15 | Discharge: 2022-09-15 | Disposition: A | Discharge status: Home / Self Care | Payer: Medicare PPO | Source: Ambulatory Visit | Attending: Family Medicine | Admitting: Family Medicine

## 2022-09-15 ENCOUNTER — Other Ambulatory Visit: Payer: Self-pay | Admitting: Family Medicine

## 2022-09-15 DIAGNOSIS — Z1231 Encounter for screening mammogram for malignant neoplasm of breast: Secondary | ICD-10-CM

## 2022-10-02 ENCOUNTER — Ambulatory Visit (INDEPENDENT_AMBULATORY_CARE_PROVIDER_SITE_OTHER): Payer: Self-pay | Admitting: Dermatology

## 2022-10-02 DIAGNOSIS — L988 Other specified disorders of the skin and subcutaneous tissue: Secondary | ICD-10-CM

## 2022-10-02 NOTE — Patient Instructions (Signed)

## 2022-10-02 NOTE — Progress Notes (Signed)
   Follow-Up Visit   Subjective  Donna Jefferson is a 82 y.o. female who presents for the following: filler for facial elastosis  The following portions of the chart were reviewed this encounter and updated as appropriate: medications, allergies, medical history  Review of Systems:  No other skin or systemic complaints except as noted in HPI or Assessment and Plan.  Objective  Well appearing patient in no apparent distress; mood and affect are within normal limits.  A focused examination was performed of the face. Relevant physical exam findings are noted in the Assessment and Plan or shown in photos.                   Assessment & Plan  Elastosis of skin Head - Anterior (Face) Restylane Refyne Filler injected today into [name], oral commissures, nasolabial folds    Filling material injection - Head - Anterior (Face) Prior to the procedure, the patient's past medical history, allergies and the rare but potential risks and complications were reviewed with the patient and a signed consent was obtained. Pre and post-treatment care was discussed and instructions provided.  Risks including vascular occlusion were discussed.    Location: See attached photo Filler Type: Restylane refyne   Procedure: The area was prepped thoroughly with Puracyn. After introducing the needle into the desired treatment area, the syringe plunger was drawn back to ensure there was no flash of blood prior to injecting the filler in order to minimize risk of intravascular injection and vascular occlusion. After injection of the filler, the treated areas were cleansed and iced to reduce swelling. Post-treatment instructions were reviewed with the patient.        Patient tolerated the procedure well. The patient will call with any problems, questions or concerns prior to their next appointment.   Lot # [id number] Exp 2023-09-27    Return in about 6 months (around 04/01/2023) for filler .  IAngelique Holm, CMA, am acting as scribe for Armida Sans, MD .   Documentation: I have reviewed the above documentation for accuracy and completeness, and I agree with the above.  Armida Sans, MD

## 2022-10-04 ENCOUNTER — Encounter: Payer: Self-pay | Admitting: Dermatology

## 2022-12-17 ENCOUNTER — Ambulatory Visit: Payer: Medicare PPO | Admitting: Dermatology

## 2022-12-17 DIAGNOSIS — L821 Other seborrheic keratosis: Secondary | ICD-10-CM

## 2022-12-17 DIAGNOSIS — L570 Actinic keratosis: Secondary | ICD-10-CM | POA: Diagnosis not present

## 2022-12-17 DIAGNOSIS — Z85828 Personal history of other malignant neoplasm of skin: Secondary | ICD-10-CM

## 2022-12-17 DIAGNOSIS — Z872 Personal history of diseases of the skin and subcutaneous tissue: Secondary | ICD-10-CM

## 2022-12-17 DIAGNOSIS — I8393 Asymptomatic varicose veins of bilateral lower extremities: Secondary | ICD-10-CM

## 2022-12-17 DIAGNOSIS — I781 Nevus, non-neoplastic: Secondary | ICD-10-CM

## 2022-12-17 DIAGNOSIS — Z1283 Encounter for screening for malignant neoplasm of skin: Secondary | ICD-10-CM | POA: Diagnosis not present

## 2022-12-17 DIAGNOSIS — L918 Other hypertrophic disorders of the skin: Secondary | ICD-10-CM

## 2022-12-17 DIAGNOSIS — Z8589 Personal history of malignant neoplasm of other organs and systems: Secondary | ICD-10-CM

## 2022-12-17 DIAGNOSIS — L578 Other skin changes due to chronic exposure to nonionizing radiation: Secondary | ICD-10-CM | POA: Diagnosis not present

## 2022-12-17 DIAGNOSIS — L820 Inflamed seborrheic keratosis: Secondary | ICD-10-CM | POA: Diagnosis not present

## 2022-12-17 DIAGNOSIS — W908XXA Exposure to other nonionizing radiation, initial encounter: Secondary | ICD-10-CM

## 2022-12-17 DIAGNOSIS — D229 Melanocytic nevi, unspecified: Secondary | ICD-10-CM

## 2022-12-17 DIAGNOSIS — L814 Other melanin hyperpigmentation: Secondary | ICD-10-CM

## 2022-12-17 DIAGNOSIS — D1801 Hemangioma of skin and subcutaneous tissue: Secondary | ICD-10-CM

## 2022-12-17 NOTE — Progress Notes (Signed)
Follow-Up Visit   Subjective  DALEYSA ZEISE is a 82 y.o. female who presents for the following: Skin Cancer Screening and Full Body Skin Exam Hx of scc, hx of isks, hx of aks  Spot at right forearm she notice a few months ago.   The patient presents for Total-Body Skin Exam (TBSE) for skin cancer screening and mole check. The patient has spots, moles and lesions to be evaluated, some may be new or changing and the patient may have concern these could be cancer.    The following portions of the chart were reviewed this encounter and updated as appropriate: medications, allergies, medical history  Review of Systems:  No other skin or systemic complaints except as noted in HPI or Assessment and Plan.  Objective  Well appearing patient in no apparent distress; mood and affect are within normal limits.  A full examination was performed including scalp, head, eyes, ears, nose, lips, neck, chest, axillae, abdomen, back, buttocks, bilateral upper extremities, bilateral lower extremities, hands, feet, fingers, toes, fingernails, and toenails. All findings within normal limits unless otherwise noted below.   Relevant physical exam findings are noted in the Assessment and Plan.  b/l arms x 2, forehead x 1, scalp x 1 (4) Erythematous stuck-on, waxy papule or plaque  chest x 3 (3) Erythematous thin papules/macules with gritty scale.     Assessment & Plan   SKIN CANCER SCREENING PERFORMED TODAY.  ACTINIC DAMAGE - Chronic condition, secondary to cumulative UV/sun exposure - diffuse scaly erythematous macules with underlying dyspigmentation - Recommend daily broad spectrum sunscreen SPF 30+ to sun-exposed areas, reapply every 2 hours as needed.  - Staying in the shade or wearing long sleeves, sun glasses (UVA+UVB protection) and wide brim hats (4-inch brim around the entire circumference of the hat) are also recommended for sun protection.  - Call for new or changing  lesions.  LENTIGINES, SEBORRHEIC KERATOSES, HEMANGIOMAS - Benign normal skin lesions - Benign-appearing - Call for any changes  MELANOCYTIC NEVI - Tan-brown and/or pink-flesh-colored symmetric macules and papules - Benign appearing on exam today - Observation - Call clinic for new or changing moles - Recommend daily use of broad spectrum spf 30+ sunscreen to sun-exposed areas.   Acrochordons (Skin Tags) - Fleshy, skin-colored pedunculated papules - Benign appearing.  - Observe. - If desired, they can be removed with an in office procedure that is not covered by insurance. - Please call the clinic if you notice any new or changing lesions.   Varicose Veins/Spider Veins - Dilated blue, purple or red veins at the lower extremities - Reassured - Smaller vessels can be treated by sclerotherapy (a procedure to inject a medicine into the veins to make them disappear) if desired, but the treatment is not covered by insurance. Larger vessels may be covered if symptomatic and we would refer to vascular surgeon if treatment desired.   HISTORY OF SQUAMOUS CELL CARCINOMA OF THE SKIN Multiple see history  - No evidence of recurrence today - No lymphadenopathy - Recommend regular full body skin exams - Recommend daily broad spectrum sunscreen SPF 30+ to sun-exposed areas, reapply every 2 hours as needed.  - Call if any new or changing lesions are noted between office visits   Inflamed seborrheic keratosis (4) b/l arms x 2, forehead x 1, scalp x 1  Symptomatic, irritating, patient would like treated.  Destruction of lesion - b/l arms x 2, forehead x 1, scalp x 1 (4) Complexity: simple   Destruction method:  cryotherapy   Informed consent: discussed and consent obtained   Timeout:  patient name, date of birth, surgical site, and procedure verified Lesion destroyed using liquid nitrogen: Yes   Region frozen until ice ball extended beyond lesion: Yes   Outcome: patient tolerated procedure  well with no complications   Post-procedure details: wound care instructions given    Actinic keratosis (3) chest x 3  Actinic keratoses are precancerous spots that appear secondary to cumulative UV radiation exposure/sun exposure over time. They are chronic with expected duration over 1 year. A portion of actinic keratoses will progress to squamous cell carcinoma of the skin. It is not possible to reliably predict which spots will progress to skin cancer and so treatment is recommended to prevent development of skin cancer.  Recommend daily broad spectrum sunscreen SPF 30+ to sun-exposed areas, reapply every 2 hours as needed.  Recommend staying in the shade or wearing long sleeves, sun glasses (UVA+UVB protection) and wide brim hats (4-inch brim around the entire circumference of the hat). Call for new or changing lesions.  Destruction of lesion - chest x 3 (3)   Return in about 1 year (around 12/17/2023) for TBSE.  IAsher Muir, CMA, am acting as scribe for Armida Sans, MD.   Documentation: I have reviewed the above documentation for accuracy and completeness, and I agree with the above.  Armida Sans, MD

## 2022-12-17 NOTE — Patient Instructions (Addendum)

## 2022-12-22 ENCOUNTER — Encounter: Payer: Self-pay | Admitting: Dermatology

## 2023-01-06 ENCOUNTER — Ambulatory Visit: Payer: Medicare PPO | Admitting: Plastic Surgery

## 2023-01-06 ENCOUNTER — Encounter: Payer: Self-pay | Admitting: Plastic Surgery

## 2023-01-06 VITALS — BP 145/60 | HR 62

## 2023-01-06 DIAGNOSIS — T8543XA Leakage of breast prosthesis and implant, initial encounter: Secondary | ICD-10-CM

## 2023-01-06 NOTE — Progress Notes (Signed)
Patient ID: Donna Jefferson, female    DOB: Jan 26, 1941, 82 y.o.   MRN: [id number]   Chief Complaint  Patient presents with   Follow-up    Patient is a 82 year old female here for follow-up on her breast.  Was seen last year because she had implants placed over 40 years ago by Dr. Dub Amis.  They were not placed for breast cancer.  The patient was having some pain and got a mammogram in 2019 and then again in 2022.  No issues were noted she does not have any issues on exam either.  The pain resolved and now she has been doing well.  She has grade 2 ptosis and was trying to decide whether or not to keep the implants or to remove them.  Now that she is doing well and not in any pain she is thinking to just leave them be for now.    Review of Systems  Constitutional: Negative.   HENT: Negative.    Eyes: Negative.   Respiratory: Negative.    Cardiovascular: Negative.   Gastrointestinal: Negative.   Endocrine: Negative.   Genitourinary: Negative.   Musculoskeletal: Negative.     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 SCCis   Squamous cell skin cancer 11/02/2013   Left medial infraclavicular. SCCis, early evolving.   Squamous cell skin cancer 07/29/2018   Left distal pretibial. WD SCC with superficial infiltration.    Past Surgical History:  Procedure Laterality Date   ABDOMINAL HYSTERECTOMY     APPENDECTOMY     AUGMENTATION MAMMAPLASTY Bilateral 1980's   CATARACT EXTRACTION, BILATERAL     CHOLECYSTECTOMY     COLONOSCOPY     EYE SURGERY Bilateral    Cataract Extraction with IOL   JOINT REPLACEMENT Right    knee   KNEE ARTHROPLASTY Left 04/09/2016   Procedure: COMPUTER ASSISTED TOTAL KNEE ARTHROPLASTY;  Surgeon: Donato Heinz, MD;   Location: [facility];  Service: Orthopedics;  Laterality: Left;   KNEE ARTHROSCOPY Left 04/02/2015   Procedure: ARTHROSCOPY KNEE. PARTIAL MEDIAL MENISECTOMY, MEDIAL CHONDROPLASTY;  Surgeon: Donato Heinz, MD;  Location: [facility];  Service: Orthopedics;  Laterality: Left;   TONSILLECTOMY     TRIGGER FINGER RELEASE Right    thumb      Current Outpatient Medications:    ALPRAZolam (XANAX) 0.25 MG tablet, Take 0.25 mg by mouth at bedtime., Disp: , Rfl:    atenolol (TENORMIN) 25 MG tablet, Take 25 mg by mouth at bedtime., Disp: , Rfl:    BIOTIN PO, Take 1 tablet by mouth 2 (two) times daily., Disp: , Rfl:    Calcium Carbonate-Vit D-Min (CALTRATE 600+D PLUS PO), Take 1 tablet by mouth 2 (two) times daily., Disp: , Rfl:    cephALEXin (KEFLEX) 500 MG capsule, Take 1 capsule (500 mg total) by mouth 2 (two) times daily., Disp: 14 capsule, Rfl: 0   COVID-19 mRNA bivalent vaccine, Pfizer, (PFIZER COVID-19 VAC BIVALENT) injection, Inject into the muscle., Disp: 0.3 mL, Rfl: 0   COVID-19 mRNA Vac-TriS, Pfizer, (PFIZER-BIONT COVID-19 VAC-TRIS) SUSP injection, Inject into the muscle., Disp: 0.3 mL, Rfl: 0   Dermatological Products, Misc. (NUVAIL) SOLN, Apply 1 application topically at bedtime., Disp:  15 mL, Rfl: 6   Glucosamine-Chondroitin (GLUCOSAMINE CHONDR COMPLEX PO), Take 1 tablet by mouth 2 (two) times daily. , Disp: , Rfl:    Misc Natural Products (GRAPE SEED COMPLEX PO), Take 1 capsule by mouth daily with lunch. , Disp: , Rfl:    Misc Natural Products (TART CHERRY ADVANCED PO), Take 1,200 mg by mouth daily., Disp: , Rfl:    mometasone (ELOCON) 0.1 % cream, Apply twice daily to affected area on back as needed for itching. Avoid applying to face, groin, and axilla. Use as directed. Long-term use can cause thinning of the skin., Disp: 45 g, Rfl: 1   Multiple Vitamin (MULTIVITAMIN WITH MINERALS) TABS tablet, Take 1 tablet by mouth daily. Women's One-A-Day, Disp: , Rfl:    omeprazole (PRILOSEC) 20 MG capsule,  Take 20 mg by mouth daily., Disp: , Rfl:    phenazopyridine (PYRIDIUM) 200 MG tablet, Take 1 tablet (200 mg total) by mouth 3 (three) times daily as needed for pain., Disp: 6 tablet, Rfl: 0   Polyethyl Glycol-Propyl Glycol (SYSTANE OP), Place 1 drop into both eyes 2 (two) times daily. , Disp: , Rfl:    Probiotic Product (ALIGN PO), Take 1 capsule by mouth daily. , Disp: , Rfl:    psyllium (HYDROCIL/METAMUCIL) 95 % PACK, Take 1 packet by mouth at bedtime., Disp: , Rfl:    SUMAtriptan (IMITREX) 100 MG tablet, Take 100 mg by mouth every 2 (two) hours as needed for migraine. May repeat in 2 hours if headache persists or recurs., Disp: , Rfl:    traMADol (ULTRAM) 50 MG tablet, Take 1-2 tablets (50-100 mg total) by mouth every 4 (four) hours as needed for moderate pain., Disp: 60 tablet, Rfl: 0   amLODipine (NORVASC) 5 MG tablet, Take by mouth., Disp: , Rfl:    Objective:   Vitals:   01/06/23 1505  BP: (!) 145/60  Pulse: 62  SpO2: 98%    Physical Exam Vitals reviewed.  Constitutional:      Appearance: Normal appearance.  HENT:     Head: Atraumatic.  Cardiovascular:     Rate and Rhythm: Normal rate.     Pulses: Normal pulses.  Pulmonary:     Effort: Pulmonary effort is normal.  Musculoskeletal:        General: No swelling or deformity.  Skin:    General: Skin is warm.  Neurological:     Mental Status: She is alert and oriented to person, place, and time.  Psychiatric:        Mood and Affect: Mood normal.        Behavior: Behavior normal.        Thought Content: Thought content normal.        Judgment: Judgment normal.     Assessment & Plan:  Rupture of implant of left breast, initial encounter  Plan to continue monitoring.  Will plan to see her back in 1 year.  Pictures were obtained of the patient and placed in the chart with the patient's or guardian's permission.   Alena Bills Caston Coopersmith, DO

## 2023-04-01 ENCOUNTER — Ambulatory Visit (INDEPENDENT_AMBULATORY_CARE_PROVIDER_SITE_OTHER): Payer: Self-pay | Admitting: Dermatology

## 2023-04-01 ENCOUNTER — Encounter: Payer: Self-pay | Admitting: Dermatology

## 2023-04-01 ENCOUNTER — Ambulatory Visit: Payer: Medicare PPO | Admitting: Dermatology

## 2023-04-01 DIAGNOSIS — L988 Other specified disorders of the skin and subcutaneous tissue: Secondary | ICD-10-CM

## 2023-04-01 DIAGNOSIS — L821 Other seborrheic keratosis: Secondary | ICD-10-CM

## 2023-04-01 NOTE — Patient Instructions (Signed)

## 2023-04-01 NOTE — Progress Notes (Signed)
   Follow-Up Visit   Subjective  Donna Jefferson is a 83 y.o. female who presents for the following: filler for facial elastosis  Check "keratosis" on scalp and leg. Dr. Gwen Pounds has frozen scalp area in the past. Thinks is growing back. Not bothersome  The following portions of the chart were reviewed this encounter and updated as appropriate: medications, allergies, medical history  Review of Systems:  No other skin or systemic complaints except as noted in HPI or Assessment and Plan.  Objective  Well appearing patient in no apparent distress; mood and affect are within normal limits.  A focused examination was performed of the face. Relevant physical exam findings are noted in the Assessment and Plan or shown in photos.  Before photos              Injection map photo   After Photos            Assessment & Plan   SEBORRHEIC KERATOSIS - Stuck-on, waxy, tan-brown  plaque at R parietal scalp, waxy tan-brown papule at left upper knee adjacent to knee replacement scar - Benign-appearing - Discussed benign etiology and prognosis. Reassured benign age-related growth.  Recommend observation.  Discussed cryotherapy if spot(s) become irritated or inflamed.  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 Call for new or changing lesions.  Staying in the shade or wearing long sleeves, sun glasses (UVA+UVB protection) and wide brim hats (4-inch brim around the entire circumference of the hat) are also recommended for sun protection.    Facial Elastosis  Prior to the procedure, the patient's past medical history, allergies and the rare but potential risks and complications were reviewed with the patient and a signed consent was obtained. Pre and post-treatment care was discussed and  instructions provided.   Location:  nasolabial folds   Filler Type: Restylane defyne  Procedure: The area was prepped thoroughly with Puracyn. After introducing the needle into the desired treatment area, the syringe plunger was drawn back to ensure there was no flash of blood prior to injecting the filler in order to minimize risk of intravascular injection and vascular occlusion. After injection of the filler, the treated areas were cleansed and iced to reduce swelling. Post-treatment instructions were reviewed with the patient.       Patient tolerated the procedure well. The patient will call with any problems, questions or concerns prior to their next appointment.   Return in about 6 months (around 10/02/2023) for Fillers.  I, Lawson Radar, CMA, am acting as scribe for Willeen Niece, MD.   Documentation: I have reviewed the above documentation for accuracy and completeness, and I agree with the above.  Willeen Niece, MD

## 2023-08-05 ENCOUNTER — Other Ambulatory Visit: Payer: Self-pay | Admitting: Family Medicine

## 2023-08-05 DIAGNOSIS — Z1231 Encounter for screening mammogram for malignant neoplasm of breast: Secondary | ICD-10-CM

## 2023-09-17 ENCOUNTER — Ambulatory Visit
Admission: RE | Admit: 2023-09-17 | Discharge: 2023-09-17 | Disposition: A | Discharge status: Home / Self Care | Source: Ambulatory Visit | Attending: Family Medicine | Admitting: Family Medicine

## 2023-09-17 ENCOUNTER — Other Ambulatory Visit: Payer: Self-pay | Admitting: Family Medicine

## 2023-09-17 DIAGNOSIS — Z1231 Encounter for screening mammogram for malignant neoplasm of breast: Secondary | ICD-10-CM | POA: Diagnosis present

## 2023-10-05 ENCOUNTER — Ambulatory Visit (INDEPENDENT_AMBULATORY_CARE_PROVIDER_SITE_OTHER): Payer: Self-pay | Admitting: Dermatology

## 2023-10-05 ENCOUNTER — Encounter: Payer: Self-pay | Admitting: Dermatology

## 2023-10-05 DIAGNOSIS — L988 Other specified disorders of the skin and subcutaneous tissue: Secondary | ICD-10-CM

## 2023-10-05 NOTE — Progress Notes (Signed)
   Follow-Up Visit   Subjective  Donna Jefferson is a 83 y.o. female who presents for the following: filler for facial elastosis  The following portions of the chart were reviewed this encounter and updated as appropriate: medications, allergies, medical history  Review of Systems:  No other skin or systemic complaints except as noted in HPI or Assessment and Plan.  Objective  Well appearing patient in no apparent distress; mood and affect are within normal limits.  A focused examination was performed of the face. Relevant physical exam findings are noted in the Assessment and Plan or shown in photos.  Before photos               After photos              Injection map photo     Assessment & Plan   ELASTOSIS OF SKIN   Facial Elastosis  Prior to the procedure, the patient's past medical history, allergies and the rare but potential risks and complications were reviewed with the patient and a signed consent was obtained. Pre and post-treatment care was discussed and instructions provided.   Location: perioral (upper lip vertical lines), lower corners of mouth  Filler Type: Restylane refyne  Procedure: Lidocaine -tetracaine  ointment was applied to treatment areas to achieve good local anesthesia. The area was prepped thoroughly with Puracyn. After introducing the needle into the desired treatment area, the syringe plunger was drawn back to ensure there was no flash of blood prior to injecting the filler in order to minimize risk of intravascular injection and vascular occlusion. After injection of the filler, the treated areas were cleansed and iced to reduce swelling. Post-treatment instructions were reviewed with the patient.       Patient tolerated the procedure well. The patient will call with any problems, questions or concerns prior to their next appointment.   Return for 63m to 1 yr for filler.  I, Grayce Saunas, RMA, am acting as scribe for Rexene Rattler, MD .   Documentation: I have reviewed the above documentation for accuracy and completeness, and I agree with the above.  Rexene Rattler, MD

## 2023-10-05 NOTE — Patient Instructions (Signed)

## 2023-11-09 ENCOUNTER — Encounter: Payer: Self-pay | Admitting: Physical Therapy

## 2023-11-09 ENCOUNTER — Ambulatory Visit: Attending: Obstetrics and Gynecology | Admitting: Physical Therapy

## 2023-11-09 DIAGNOSIS — M533 Sacrococcygeal disorders, not elsewhere classified: Secondary | ICD-10-CM | POA: Insufficient documentation

## 2023-11-09 DIAGNOSIS — M5459 Other low back pain: Secondary | ICD-10-CM | POA: Diagnosis present

## 2023-11-09 DIAGNOSIS — R2689 Other abnormalities of gait and mobility: Secondary | ICD-10-CM | POA: Diagnosis present

## 2023-11-09 NOTE — Therapy (Signed)
 OUTPATIENT PHYSICAL THERAPY EVALUATION   Patient Name: Donna Jefferson MRN: [id number] DOB:1940/02/21, 83 y.o., female Today's Date: 11/09/2023   PT End of Session - 11/09/23 1117     Visit Number 1    Number of Visits 10    Date for Recertification  01/18/24    PT Start Time 1108    PT Stop Time 1150    PT Time Calculation (min) 42 min    Activity Tolerance Patient tolerated treatment well;No increased pain    Behavior During Therapy WFL for tasks assessed/performed          Past Medical History:  Diagnosis Date   Actinic keratosis    Arthritis    osteoarthritis   GERD (gastroesophageal reflux disease)    Headache    migraines   Hx of squamous cell carcinoma    multiple sites   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 SCCis   Squamous cell skin cancer 11/02/2013   Left medial infraclavicular. SCCis, early evolving.   Squamous cell skin cancer 07/29/2018   Left distal pretibial. WD SCC with superficial infiltration.   Past Surgical History:  Procedure Laterality Date   ABDOMINAL HYSTERECTOMY     APPENDECTOMY     AUGMENTATION MAMMAPLASTY Bilateral 1980's   CATARACT EXTRACTION, BILATERAL     CHOLECYSTECTOMY     COLONOSCOPY     EYE SURGERY Bilateral    Cataract Extraction with IOL   JOINT REPLACEMENT Right    knee   KNEE ARTHROPLASTY Left 04/09/2016   Procedure: COMPUTER ASSISTED TOTAL KNEE ARTHROPLASTY;  Surgeon: Lynwood SHAUNNA Hue, MD;  Location: [facility];  Service: Orthopedics;  Laterality: Left;   KNEE ARTHROSCOPY Left 04/02/2015   Procedure: ARTHROSCOPY KNEE. 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  REFERRING PROVIDER: Schermerhorn   REFERRING DIAG: cystocele-midlinee  Rationale for Evaluation and Treatment Rehabilitation  THERAPY DIAG:  Sacrococcygeal disorders, not elsewhere classified  Other abnormalities of gait and mobility  Other low back pain  ONSET DATE:   SUBJECTIVE:  SUBJECTIVE STATEMENT: 1) vaginal bulge: pt noticed it until she is in the shower when she is washing herself. Pt is pushing it up. Af first when it first happened. It bothered her. Pt has adjusted to it mentally and has not noticed bothering her. Pt goes up and down stairs. Pt does not notice the prolapse worsening when lifting small items. Urination:  I feel like I got to go but I cannnot because bladder is in the way or she is trying to go a second Urination difficulty occurs  50% of the time . Pt strains 100% of the time with bowel movements. Stool consistency Type 1-2 . Takes probiotics/ Metamucil, eating fiber, drinks lots of water   2) low back pain located between low ribs- occurred for the last 6 months, after shopping 2-3 hours, doing dishes, 6-7/10 pain , uses cold pack for full relief   3) L thigh numbness started this past summer.  It occurs after she has been up for a while, walking, cooking, . It appears suddenly. It eases with sitting. Numbness intensity 8/10 covers the top of her thigh above the knee to the side hip   4) Nocturia:  3-4 x night, has not had sleep study to rule out/ in OSA,    PERTINENT HISTORY:  2 vaginal deliveries , does not remember perineal scars   Abdominal hysterectomy Appendectomy Gall bladder removal   B TKA   Skin cancer   PAIN:  Are you having pain? Yes: see above   PRECAUTIONS:  No  WEIGHT BEARING RESTRICTIONS: No  FALLS:  Has patient fallen in last 6 months? No   LIVING ENVIRONMENT: Lives with: husband  Lives in: one story  Stairs: 3 STE with rail   OCCUPATION: retired Runner, broadcasting/film/video , hobbies: reading, bible study, aquatic classes M, W, F, beach trips once a month,   PLOF: IND  PATIENT GOALS:  Low back pain resolves,  prolapse issue improve, decrease urination at night , decrease L thigh numbness improved     OBJECTIVE:    [facility] PT Assessment - 11/09/23 1113       Palpation   Spinal mobility limited rotation B, sideflexion less on L > R, no reproduction of pain    SI assessment  L shoulder, R iliac crest higher, thoracic shift to R      Ambulation/Gait   Gait Comments 1.24 m/s, pelvic shift to L, limited L thoracic posterior rotation    Posture:  toes turned in          Self-Care   Self-Care Other Self-Care Comments    Other Self-Care Comments  explained regional interdependent approach to address pain at different body parts, explained upcoming visits to realign posture and spine.pelvis to improve Sx and achieve goals, showed anatomy images of deep core system ,  Provided information on relationship between OSA and nocturia, explained importance of getting screened for OSA      Therapeutic Activites    Therapeutic Activities Other Therapeutic Activities    Other Therapeutic Activities inquired about meaningful tasks and role of learning proper body mechanics, future sessions to help realign pelvis and spine and to learn co-activation of deep core system when performing against gravity tasks . These  improvements will help address pain and Sx     Neuro Re-ed    Neuro Re-ed Details  cued for alignment and proprioception of pelvis and LKC and spine in  sit to stand and for proper sitting posture to activate deep core system  HOME EXERCISE PROGRAM: See pt instruction section    ASSESSMENT:  CLINICAL IMPRESSION: Pt is a 83  yo   who presents with  following issues which impact QOL, ADL,  fitness, social and community activities:   1) vaginal bulge: 2) low back pain  3) L thigh numbness 4) Nocturia   Pt's musculoskeletal assessment revealed uneven pelvic girdle and shoulder height, asymmetries to gait pattern, limited spinal /pelvic mobility, dyscoordination and strength of pelvic floor mm, hip weakness, poor body mechanics which places strain on the abdominal/pelvic floor mm. These are deficits that indicate an ineffective intraabdominal pressure system associated with increased risk for pt's Sx.     Pt will benefit from propioception/ coordination/ body mechanics training and education with gravity-loaded tasks at work and home and  fitness modifications in order to gain a more effective intraabdominal pressure system to minimize Sx. Advised pt to not perform sit-ups and crunches as these movement patterns lead to more downward forces on the pelvic floor, negatively impacting abdominopelvic/spinal dysfunctions.   Pt was provided education on etiology of Sx with anatomy, physiology explanation with images along with the benefits of customized pelvic PT Tx based on pt's medical conditions and musculoskeletal deficits.  Explained the physiology of deep core mm coordination and roles of pelvic floor function in urination, defecation, sexual function, and postural control with deep core mm system, and the role of posture and alignment to help pelvic issues.    Regional interdependent approaches will yield greater benefits in pt's POC due to the complexity of pt's medical Hx  Following Tx today which pt tolerated without complaints,  pt demo'd proper body mechanics to minimize straining pelvic floor.  Provided information on relationship between OSA and nocturia, explained importance of getting screened for OSA  Plan to address realignment of spine/ pelvis at next session to help promote optimize IAP system for improved pelvic  floor function, trunk stability, gait, balance, stabilization with mobility tasks.  Plan to address pelvic floor issues once pelvis and spine are realigned to yield better outcomes.     Pt benefits from skilled PT to address these Sx:  1) vaginal bulge: 2) low back pain  3) L thigh numbness 4) Nocturia   OBJECTIVE IMPAIRMENTS decreased activity tolerance, decreased coordination, decreased endurance, decreased mobility, difficulty walking, decreased ROM, decreased strength, decreased safety awareness, hypomobility, increased muscle spasms, impaired flexibility, improper body mechanics, postural dysfunction, and pain. scar restrictions   ACTIVITY LIMITATIONS  self-care,  sleep, home chores, work tasks    PARTICIPATION LIMITATIONS:  community, cooking  activities,     PERSONAL FACTORS   affecting patient's functional outcome:    REHAB POTENTIAL: Good   CLINICAL DECISION MAKING: Evolving/moderate complexity   EVALUATION COMPLEXITY: Moderate    PATIENT EDUCATION:    Education details: Showed pt anatomy images. Explained muscles attachments/ connection, physiology of deep core system/ spinal- thoracic-pelvis-lower kinetic chain as they relate to pt's presentation, Sx, and past Hx. Explained what and how these areas of deficits need to be restored to balance and function    See Therapeutic activity / neuromuscular re-education section  Answered pt's questions.   Person educated: Patient Education method: Explanation, Demonstration, Tactile cues, Verbal cues, and Handouts Education comprehension: verbalized understanding, returned demonstration, verbal cues required, tactile cues required, and needs further education     PLAN: PT FREQUENCY: 1x/week   PT DURATION: 10 weeks   PLANNED INTERVENTIONS:   Gait training;Stair training;Functional mobility training;DME Instruction;Therapeutic activities;Therapeutic exercise;Balance training;Neuromuscular re-education;Patient/family  education;Vestibular;Visual/perceptual remediation/compensation;Passive  range of motion;Moist Heat;Cryotherapy;Traction;Canalith Repostioning;Joint Manipulations;Manual lymph drainage;Manual techniques;Scar mobilization;Energy conservation;Dry needling;ADLs/Self Care Home Management;Biofeedback;Electrical Stimulation;Taping    PLAN FOR NEXT SESSION: See clinical impression for plan     GOALS: Goals reviewed with patient? Yes  SHORT TERM GOALS: Target date: 12/07/2023    Pt will demo IND with HEP                    Baseline: Not IND            Goal status: INITIAL   LONG TERM GOALS: Target date: 01/18/2024    1.Pt will demo proper deep core coordination without chest breathing and optimal excursion of diaphragm/pelvic floor in order to promote spinal stability and pelvic floor function  Baseline: dyscoordination Goal status: INITIAL  2.  Pt will demo proper body mechanics in against gravity tasks and ADLs  work tasks, fitness  to minimize straining pelvic floor / back    Baseline: not IND, improper form that places strain on pelvic floor  Goal status: INITIAL    3. Pt will demo increased gait speed > 1.3 m/s with reciprocal gait pattern, longer stride length  in order to ambulate safely in community and return to fitness routine  Baseline:  Goal status: INITIAL    4. Pt will demo levelled pelvic girdle and shoulder height in order to progress to deep core strengthening HEP and restore mobility at spine, pelvis, gait, posture minimize falls, and improve balance  Baseline:  Goal status: INITIAL   5. Pt will improve PFDI-7 questionnaire to  pts  score change  to demo improved QOL  Baseline:    ( greater pts indicate greater negative impact on QOL)   62  pts  ( total)  38   pts  ( UIQ-7 )  19  pts  ( CRAIQ-7 )  5   pts  ( POPIQ-7 )  Baseline:  Goal status: INITIAL  6.  Pt will report able to urinate without needing to go a second time and or able to initiate urination  without prolapse in the way across > 75% of the time  Baseline:  Urination:  I feel like I got to go but I cannnot because bladder is in the way or she is trying to go a second Urination difficulty occurs  50% of the time Goal status: INITIAL   7. Pt will report no more straining with bowel movements  and report  Stool type 4   from _0% of the time to > 50% of the time, decrease type 1-2 from 100% of time to < 50% of the time  Baseline: straining 100% of the time  Goal status: INITIAL    8. Pt will communicate with MD about sleep study  Baseline: pt has not tested , nocturia 3-4 x night  Goal Status: INITIAL     9. Pt will report decreased numbness along L thigh from 8/10 to < 4/10 intensity while walking, cooking, Baseline: numbness along L thigh 8/10  walking, cooking, Goal Status: INITIAL      Pia Lupe Plump, PT 11/09/2023, 11:23 AM

## 2023-11-09 NOTE — Patient Instructions (Addendum)
 Avoid straining pelvic floor, abdominal muscles , spine  Use log rolling technique instead of getting out of bed with your neck or the sit-up  Log rolling into and out of bed Log rolling into and out of bed If getting out of bed on R side, Bent knees, scoot hips/ shoulder to L  Raise R arm completely overhead, rolling onto armpit  Then lower bent knees to bed to get into complete side lying position  Then drop legs off bed, and push up onto R elbow/forearm, and use L hand to push onto the bed __ Proper body mechanics with getting out of a chair to decrease strain  on back &pelvic floor   Avoid holding your breath when Getting out of the chair:  Scoot to front part of chair chair Heels behind knees, feet are hip width apart, nose over toes  Inhale like you are smelling roses Exhale to stand  ___  Sitting with feet on ground, four points of contact Catch yourself crossing ankles and thighs  __  Emailed research articles on relationship between nocturia and OSA

## 2023-11-16 ENCOUNTER — Encounter: Admitting: Physical Therapy

## 2023-11-17 ENCOUNTER — Encounter: Admitting: Physical Therapy

## 2023-11-23 ENCOUNTER — Ambulatory Visit: Admitting: Physical Therapy

## 2023-11-24 ENCOUNTER — Ambulatory Visit: Admitting: Physical Therapy

## 2023-11-24 DIAGNOSIS — M533 Sacrococcygeal disorders, not elsewhere classified: Secondary | ICD-10-CM

## 2023-11-24 DIAGNOSIS — R2689 Other abnormalities of gait and mobility: Secondary | ICD-10-CM

## 2023-11-24 DIAGNOSIS — M5459 Other low back pain: Secondary | ICD-10-CM

## 2023-11-24 NOTE — Patient Instructions (Signed)
  ZigZag stretch and scar mobilization  Reclined twist for hips and side of the hips/ legs    Lay on your back, knees bend, dig elbows and feet into bed to exhale and lift buttocks up to  Scoot hips and feet  to the L , leave shoulders in place Gentle pull with R palm over scar   Wobble knees to the R side 45 deg and to midline  10 reps   Then reach for R thigh with R hand cross body, straighten knee and point toes toward face, bend knee and repeat straigthen 10 reps to lengthen hamstring and ITband (side of hip)   Repeat on other side: Scoot hip and feet  R ,  leave shoulders in place Wobble knees to the L side 45 deg and to midline  10 reps   __   body mechanics  Avoid straining pelvic floor, abdominal muscles , spine  Use log rolling technique instead of getting out of bed with your neck or the sit-up     Log rolling into and out of bed   Log rolling into and out of bed If getting out of bed on R side, Bent knees, scoot hips/ shoulder to L  Raise R arm completely overhead, rolling onto armpit  Then lower bent knees to bed to get into complete side lying position  Then drop legs off bed, and push up onto R elbow/forearm, and use L hand to push onto the bed    Dig elbows and feet to lift hte buttocks and scoot without lifting head

## 2023-11-24 NOTE — Therapy (Addendum)
 OUTPATIENT PHYSICAL THERAPY treatment   Patient Name: Donna Jefferson MRN: [id number] DOB:July 17, 1940, 83 y.o., female Today's Date: 11/24/2023   PT End of Session - 11/24/23 1432     Visit Number 2    Number of Visits 10    Date for Recertification  01/18/24    PT Start Time 1422    PT Stop Time 1500    PT Time Calculation (min) 38 min    Activity Tolerance Patient tolerated treatment well;No increased pain    Behavior During Therapy WFL for tasks assessed/performed          Past Medical History:  Diagnosis Date   Actinic keratosis    Arthritis    osteoarthritis   GERD (gastroesophageal reflux disease)    Headache    migraines   Hx of squamous cell carcinoma    multiple sites   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 SCCis   Squamous cell skin cancer 11/02/2013   Left medial infraclavicular. SCCis, early evolving.   Squamous cell skin cancer 07/29/2018   Left distal pretibial. WD SCC with superficial infiltration.   Past Surgical History:  Procedure Laterality Date   ABDOMINAL HYSTERECTOMY     APPENDECTOMY     AUGMENTATION MAMMAPLASTY Bilateral 1980's   CATARACT EXTRACTION, BILATERAL     CHOLECYSTECTOMY     COLONOSCOPY     EYE SURGERY Bilateral    Cataract Extraction with IOL   JOINT REPLACEMENT Right    knee   KNEE ARTHROPLASTY Left 04/09/2016   Procedure: COMPUTER ASSISTED TOTAL KNEE ARTHROPLASTY;  Surgeon: Lynwood SHAUNNA Hue, MD;  Location: [facility];  Service: Orthopedics;  Laterality: Left;   KNEE ARTHROSCOPY Left 04/02/2015   Procedure: ARTHROSCOPY KNEE. 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  REFERRING PROVIDER: Schermerhorn   REFERRING DIAG: cystocele-midlinee  Rationale for Evaluation and Treatment Rehabilitation  THERAPY DIAG:  Sacrococcygeal disorders, not elsewhere classified  Other abnormalities of gait and mobility  Other low back pain  ONSET DATE:   SUBJECTIVE:           SUBJECTIVE STATEMENT TODAY:  Pt has been mindful to not cross her ankles and to sit firmly planted with both feet           . Pt noticed her LBP decreased since last session.  SUBJECTIVE STATEMENT ON EVAL 11/09/23 : 1) vaginal bulge: pt noticed it until she is in the shower when she is washing herself. Pt is pushing it up. Af first when it first happened. It bothered her. Pt has adjusted to it mentally and has not noticed bothering her. Pt goes up and down stairs. Pt does not notice the prolapse worsening when lifting small items. Urination:  I feel like I got to go but I cannnot because bladder is in the way or she is trying to go a second Urination difficulty occurs  50% of the time . Pt strains 100% of the time with bowel movements. Stool consistency Type 1-2 . Takes probiotics/ Metamucil, eating fiber, drinks lots of water   2) low back pain located between low ribs- occurred for the last 6 months, after shopping 2-3 hours, doing dishes, 6-7/10 pain , uses cold pack for full relief   3) L thigh numbness started this past summer.  It occurs after she has been up for a while, walking, cooking, . It appears suddenly. It eases with sitting. Numbness intensity 8/10 covers the top of her thigh above the knee to the side hip   4) Nocturia:  3-4 x night, has not had sleep study to rule out/ in OSA,    PERTINENT HISTORY:  2 vaginal deliveries , does not  remember perineal scars   Abdominal hysterectomy Appendectomy Gall bladder removal   B TKA   Skin cancer   PAIN:  Are you having pain? Yes: see above   PRECAUTIONS: No  WEIGHT BEARING RESTRICTIONS: No  FALLS:  Has patient fallen in last 6 months? No   LIVING ENVIRONMENT: Lives with: husband  Lives in: one story  Stairs: 3 STE with rail   OCCUPATION: retired runner, broadcasting/film/video , hobbies: reading, bible study, aquatic classes M, W, F, beach trips once a month,   PLOF: IND  PATIENT GOALS:  Low back pain resolves,  prolapse issue improve, decrease urination at night , decrease L thigh numbness improved     OBJECTIVE:   [facility] PT Assessment - 11/24/23 1432       Palpation   SI assessment  levelled shoudlers and pelvis    Palpation comment severely restricted abdominal scar in upper R quadrant, limited anterior/lateral R ib expansion      Bed Mobility   Bed Mobility --   lifting head with scooting and logrolling           [facility] Adult PT Treatment/Exercise - 11/24/23 1432       Therapeutic Activites    Therapeutic Activities Other Therapeutic Activities    Other Therapeutic Activities practied getting in and out of bed and scooting to minimize straining pelvic floor and back      Neuro Re-ed    Neuro Re-ed Details  cued for propioception and technique for stretching upper R abdominal scar to promote deep core function,  cued for zig zag stretch for the low back and getting in and out of bed, logrolling/ scooting without lifting head with propcieotpion and alignment tehcniques      Manual Therapy   Manual therapy comments fascial releases over R upper ab scar to promote deep core function             HOME EXERCISE PROGRAM: See pt instruction section    ASSESSMENT:  CLINICAL IMPRESSION:  Pt showed levelled pelvis and shoulder alignment which is an improvement with her compliant to not crossing her ankles and paying attention to  her posture.  Addressed severely  restricted R upper abdominal scar with fascial  mobilization techniques.  Post Tx, pt demo'd improved anterior/lateral excursion of diaphragm and low ribs which will help pt progress to deep core training next session.    Following Tx today which pt tolerated without complaints,  pt demo'd proper body mechanics to minimize straining pelvic floor  with logrolling and scooting in bed technique.  Anticipate these improvements will  to help promote optimize IAP system for improved pelvic floor function, trunk stability, gait, balance, stabilization with mobility tasks.  Plan to address pelvic floor issues once pelvis and spine are realigned to yield better outcomes. Regional interdependent approaches will yield greater benefits in pt's POC due to the complexity of pt's medical Hx    Pt benefits from skilled PT to address these Sx:  1) vaginal bulge: 2) low back pain  3) L thigh numbness 4) Nocturia   OBJECTIVE IMPAIRMENTS decreased activity tolerance, decreased coordination, decreased endurance, decreased mobility, difficulty walking, decreased ROM, decreased strength, decreased safety awareness, hypomobility, increased muscle spasms, impaired flexibility, improper body mechanics, postural dysfunction, and pain. scar restrictions   ACTIVITY LIMITATIONS  self-care,  sleep, home chores, work tasks    PARTICIPATION LIMITATIONS:  community, cooking  activities,     PERSONAL FACTORS   affecting patient's functional outcome:    REHAB POTENTIAL: Good   CLINICAL DECISION MAKING: Evolving/moderate complexity   EVALUATION COMPLEXITY: Moderate    PATIENT EDUCATION:    Education details: Showed pt anatomy images. Explained muscles attachments/ connection, physiology of deep core system/ spinal- thoracic-pelvis-lower kinetic chain as they relate to pt's presentation, Sx, and past Hx. Explained what and how these areas of deficits need to be restored to balance and function    See Therapeutic activity  / neuromuscular re-education section  Answered pt's questions.   Person educated: Patient Education method: Explanation, Demonstration, Tactile cues, Verbal cues, and Handouts Education comprehension: verbalized understanding, returned demonstration, verbal cues required, tactile cues required, and needs further education     PLAN: PT FREQUENCY: 1x/week   PT DURATION: 10 weeks   PLANNED INTERVENTIONS:   Gait training;Stair training;Functional mobility training;DME Instruction;Therapeutic activities;Therapeutic exercise;Balance training;Neuromuscular re-education;Patient/family education;Vestibular;Visual/perceptual remediation/compensation;Passive range of motion;Moist Heat;Cryotherapy;Traction;Canalith Repostioning;Joint Manipulations;Manual lymph drainage;Manual techniques;Scar mobilization;Energy conservation;Dry needling;ADLs/Self Care Home Management;Biofeedback;Electrical Stimulation;Taping    PLAN FOR NEXT SESSION: See clinical impression for plan     GOALS: Goals reviewed with patient? Yes  SHORT TERM GOALS: Target date: 12/07/2023    Pt will demo IND with HEP                    Baseline: Not IND            Goal status: INITIAL   LONG TERM GOALS: Target date: 01/18/2024    1.Pt will demo proper deep core coordination without chest breathing and optimal excursion of diaphragm/pelvic floor in order to promote spinal stability and pelvic floor function  Baseline: dyscoordination Goal status: INITIAL  2.  Pt will demo proper body mechanics in against gravity tasks and ADLs  work tasks, fitness  to minimize straining pelvic floor / back    Baseline: not IND, improper form that places strain on pelvic floor  Goal status: INITIAL    3. Pt will demo increased gait speed > 1.3 m/s with reciprocal gait pattern, longer stride length  in order to ambulate safely in community and return to fitness routine  Baseline:  Goal status: INITIAL    4. Pt will  demo levelled  pelvic girdle and shoulder height in order to progress to deep core strengthening HEP and restore mobility at spine, pelvis, gait, posture minimize falls, and improve balance  Baseline:  Goal status: INITIAL   5. Pt will improve PFDI-7 questionnaire to  pts  score change  to demo improved QOL  Baseline:    ( greater pts indicate greater negative impact on QOL)   62  pts  ( total)  38   pts  ( UIQ-7 )  19  pts  ( CRAIQ-7 )  5   pts  ( POPIQ-7 )  Baseline:  Goal status: INITIAL  6.  Pt will report able to urinate without needing to go a second time and or able to initiate urination without prolapse in the way across > 75% of the time  Baseline:  Urination:  I feel like I got to go but I cannnot because bladder is in the way or she is trying to go a second Urination difficulty occurs  50% of the time Goal status: INITIAL   7. Pt will report no more straining with bowel movements  and report  Stool type 4   from _0% of the time to > 50% of the time, decrease type 1-2 from 100% of time to < 50% of the time  Baseline: straining 100% of the time  Goal status: INITIAL    8. Pt will communicate with MD about sleep study  Baseline: pt has not tested , nocturia 3-4 x night  Goal Status: INITIAL     9. Pt will report decreased numbness along L thigh from 8/10 to < 4/10 intensity while walking, cooking, Baseline: numbness along L thigh 8/10  walking, cooking, Goal Status: INITIAL      Pia Lupe Plump, PT 11/24/2023, 3:01 PM

## 2023-11-30 ENCOUNTER — Ambulatory Visit: Admitting: Physical Therapy

## 2023-12-01 ENCOUNTER — Ambulatory Visit: Attending: Obstetrics and Gynecology | Admitting: Physical Therapy

## 2023-12-01 DIAGNOSIS — M5459 Other low back pain: Secondary | ICD-10-CM | POA: Diagnosis present

## 2023-12-01 DIAGNOSIS — M533 Sacrococcygeal disorders, not elsewhere classified: Secondary | ICD-10-CM | POA: Diagnosis present

## 2023-12-01 DIAGNOSIS — R2689 Other abnormalities of gait and mobility: Secondary | ICD-10-CM | POA: Diagnosis present

## 2023-12-01 NOTE — Patient Instructions (Addendum)
 Stretches :   Neck / shoulder stretches:    Lying on back - small sushi roll towel under neck  _ 6 directions   Chin up, down Rotation like changing lanes when driving Ear to shoulder like puppy dog   10 reps    _angel wings, lower elbows down , keep arms touching bed  10 reps    __________  Deep core level 1 ( ( hANDOUT)

## 2023-12-01 NOTE — Therapy (Addendum)
 OUTPATIENT PHYSICAL THERAPY treatment  Patient Name: Donna Jefferson MRN: [id number] DOB:1940-06-18, 83 y.o., female Today's Date: 12/01/2023   PT End of Session - 12/01/23 1458     Visit Number 3    Number of Visits 10    Date for Recertification  01/18/24    PT Start Time 1415    PT Stop Time 1500    PT Time Calculation (min) 45 min    Activity Tolerance Patient tolerated treatment well;No increased pain    Behavior During Therapy WFL for tasks assessed/performed          Past Medical History:  Diagnosis Date   Actinic keratosis    Arthritis    osteoarthritis   GERD (gastroesophageal reflux disease)    Headache    migraines   Hx of squamous cell carcinoma    multiple sites   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 SCCis   Squamous cell skin cancer 11/02/2013   Left medial infraclavicular. SCCis, early evolving.   Squamous cell skin cancer 07/29/2018   Left distal pretibial. WD SCC with superficial infiltration.   Past Surgical History:  Procedure Laterality Date   ABDOMINAL HYSTERECTOMY     APPENDECTOMY     AUGMENTATION MAMMAPLASTY Bilateral 1980's   CATARACT EXTRACTION, BILATERAL     CHOLECYSTECTOMY     COLONOSCOPY     EYE SURGERY Bilateral    Cataract Extraction with IOL   JOINT REPLACEMENT Right    knee   KNEE ARTHROPLASTY Left 04/09/2016   Procedure: COMPUTER ASSISTED TOTAL KNEE ARTHROPLASTY;  Surgeon: Lynwood SHAUNNA Hue, MD;  Location: [facility];  Service: Orthopedics;  Laterality: Left;   KNEE ARTHROSCOPY Left 04/02/2015   Procedure: ARTHROSCOPY KNEE. 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  REFERRING PROVIDER: [facility]   REFERRING DIAG: cystocele-midlinee  Rationale for Evaluation and Treatment Rehabilitation  THERAPY DIAG:  Sacrococcygeal disorders, not elsewhere classified  Other abnormalities of gait and mobility  Other low back pain  ONSET DATE:   SUBJECTIVE:           SUBJECTIVE STATEMENT TODAY:  Pt has been mindful to not cross her ankles and to sit firmly planted with both feet           . Pt noticed her LBP decreased since last session.  SUBJECTIVE STATEMENT ON EVAL 11/09/23 : 1) vaginal bulge: pt noticed it until she is in the shower when she is washing herself. Pt is pushing it up. Af first when it first happened. It bothered her. Pt has adjusted to it mentally and has not noticed bothering her. Pt goes up and down stairs. Pt does not notice the prolapse worsening when lifting small items. Urination:  I feel like I got to go but I cannnot because bladder is in the way or she is trying to go a second Urination difficulty occurs  50% of the time . Pt strains 100% of the time with bowel movements. Stool consistency Type 1-2 . Takes probiotics/ Metamucil, eating fiber, drinks lots of water   2) low back pain located between low ribs- occurred for the last 6 months, after shopping 2-3 hours, doing dishes, 6-7/10 pain , uses cold pack for full relief   3) L thigh numbness started this past summer.  It occurs after she has been up for a while, walking, cooking, . It appears suddenly. It eases with sitting. Numbness intensity 8/10 covers the top of her thigh above the knee to the side hip   4) Nocturia:  3-4 x night, has not had sleep study to rule out/ in OSA,    PERTINENT HISTORY:  2 vaginal deliveries , does not  remember perineal scars   Abdominal hysterectomy Appendectomy Gall bladder removal   B TKA   Skin cancer   PAIN:  Are you having pain? Yes: see above   PRECAUTIONS: No  WEIGHT BEARING RESTRICTIONS: No  FALLS:  Has patient fallen in last 6 months? No   LIVING ENVIRONMENT: Lives with: husband  Lives in: one story  Stairs: 3 STE with rail   OCCUPATION: retired runner, broadcasting/film/video , hobbies: reading, bible study, aquatic classes M, W, F, beach trips once a month,   PLOF: IND  PATIENT GOALS:  Low back pain resolves,  prolapse issue improve, decrease urination at night , decrease L thigh numbness improved     OBJECTIVE:   [facility] PT Assessment - 12/01/23 1458       Coordination   Coordination and Movement Description ab and chest mm oversue with deep core training      Palpation   SI assessment  levelled shoudlers and pelvis    Palpation comment tightness along levator scapular R, intercotal mm tightness, back mm R tightness , abdominal scar restrictions less restricted but still limiting lateral excursion  of diaphragm            [facility] Adult PT Treatment/Exercise - 12/01/23 1500       Neuro Re-ed    Neuro Re-ed Details  provided propioceptive cues and motor pattern sequence for neck ROM / scapular mobility, and deep core level 1 training  to minimzie ab / chest/ upper trap mm overuse and optimize diaphragm and deep core coordination, cued for cervical retraction with neck ROM      Manual Therapy   Manual therapy comments fascial releases over R upper ab scar to promote deep core function, occiptal release and STM/MWM at medial scapular region to minimize mm tensions             HOME EXERCISE PROGRAM: See pt instruction section    ASSESSMENT:  CLINICAL IMPRESSION:  Pt showed levelled pelvis and shoulder alignment which is an improvement with her compliant to not crossing her ankles and paying attention to her posture.  Further addressed severely restricted R upper  abdominal scar with fascial  mobilization techniques and tight neck/ shoulder mm on the R with manual Tx techniques modified to accommodated for comfort. [name]  Post Tx, pt demo'd improved  mobility and  anterior/lateral excursion of diaphragm and low ribs.    Progressed to deep core training level 1 only today.     Provided propioceptive cues and motor pattern sequence for neck ROM / scapular mobility, and deep core level 1-2 training to minimzie ab / chest/ upper trap mm overuse and optimize diaphragm and deep core coordination, cued for cervical retraction with neck ROM .   Plan to progress to  deep core level 2  training next session, add cervicoscapular/ thoracolumbar strengthening.  Anticipate these improvements will  to help promote optimize IAP system for improved pelvic floor function, trunk stability, gait, balance, stabilization with mobility tasks.  Plan to address pelvic floor issues once pelvis and spine are realigned to yield better outcomes. Regional interdependent approaches will yield greater benefits in pt's POC due to the complexity of pt's medical Hx    Pt benefits from skilled PT to address these Sx:  1) vaginal bulge: 2) low back pain  3) L thigh numbness 4) Nocturia   OBJECTIVE IMPAIRMENTS decreased activity tolerance, decreased coordination, decreased endurance, decreased mobility, difficulty walking, decreased ROM, decreased strength, decreased safety awareness, hypomobility, increased muscle spasms, impaired flexibility, improper body mechanics, postural dysfunction, and pain. scar restrictions   ACTIVITY LIMITATIONS  self-care,  sleep, home chores, work tasks    PARTICIPATION LIMITATIONS:  community, cooking  activities,     PERSONAL FACTORS   affecting patient's functional outcome:    REHAB POTENTIAL: Good   CLINICAL DECISION MAKING: Evolving/moderate complexity   EVALUATION COMPLEXITY: Moderate    PATIENT EDUCATION:    Education details: Showed pt anatomy  images. Explained muscles attachments/ connection, physiology of deep core system/ spinal- thoracic-pelvis-lower kinetic chain as they relate to pt's presentation, Sx, and past Hx. Explained what and how these areas of deficits need to be restored to balance and function    See Therapeutic activity / neuromuscular re-education section  Answered pt's questions.   Person educated: Patient Education method: Explanation, Demonstration, Tactile cues, Verbal cues, and Handouts Education comprehension: verbalized understanding, returned demonstration, verbal cues required, tactile cues required, and needs further education     PLAN: PT FREQUENCY: 1x/week   PT DURATION: 10 weeks   PLANNED INTERVENTIONS:   Gait training;Stair training;Functional mobility training;DME Instruction;Therapeutic activities;Therapeutic exercise;Balance training;Neuromuscular re-education;Patient/family education;Vestibular;Visual/perceptual remediation/compensation;Passive range of motion;Moist Heat;Cryotherapy;Traction;Canalith Repostioning;Joint Manipulations;Manual lymph drainage;Manual techniques;Scar mobilization;Energy conservation;Dry needling;ADLs/Self Care Home Management;Biofeedback;Electrical Stimulation;Taping    PLAN FOR NEXT SESSION: See clinical impression for plan     GOALS: Goals reviewed with patient? Yes  SHORT TERM GOALS: Target date: 12/07/2023    Pt will demo IND with HEP                    Baseline: Not IND            Goal status: INITIAL   LONG TERM GOALS: Target date: 01/18/2024    1.Pt will demo proper deep core coordination without chest breathing and optimal excursion of diaphragm/pelvic floor in order to promote spinal stability and pelvic floor function  Baseline: dyscoordination Goal status: INITIAL  2.  Pt will demo proper body mechanics in against gravity tasks and ADLs  work tasks, fitness  to minimize straining pelvic floor / back    Baseline: not IND, improper form  that places strain on  pelvic floor  Goal status: INITIAL    3. Pt will demo increased gait speed > 1.3 m/s with reciprocal gait pattern, longer stride length  in order to ambulate safely in community and return to fitness routine  Baseline:  Goal status: INITIAL    4. Pt will demo levelled pelvic girdle and shoulder height in order to progress to deep core strengthening HEP and restore mobility at spine, pelvis, gait, posture minimize falls, and improve balance  Baseline:  Goal status: INITIAL   5. Pt will improve PFDI-7 questionnaire to  pts  score change  to demo improved QOL  Baseline:    ( greater pts indicate greater negative impact on QOL)   62  pts  ( total)  38   pts  ( UIQ-7 )  19  pts  ( CRAIQ-7 )  5   pts  ( POPIQ-7 )  Baseline:  Goal status: INITIAL  6.  Pt will report able to urinate without needing to go a second time and or able to initiate urination without prolapse in the way across > 75% of the time  Baseline:  Urination:  I feel like I got to go but I cannnot because bladder is in the way or she is trying to go a second Urination difficulty occurs  50% of the time Goal status: INITIAL   7. Pt will report no more straining with bowel movements  and report  Stool type 4   from _0% of the time to > 50% of the time, decrease type 1-2 from 100% of time to < 50% of the time  Baseline: straining 100% of the time  Goal status: INITIAL    8. Pt will communicate with MD about sleep study  Baseline: pt has not tested , nocturia 3-4 x night  Goal Status: INITIAL     9. Pt will report decreased numbness along L thigh from 8/10 to < 4/10 intensity while walking, cooking, Baseline: numbness along L thigh 8/10  walking, cooking, Goal Status: INITIAL      Pia Lupe Plump, PT 12/01/2023, 3:02 PM

## 2023-12-07 ENCOUNTER — Ambulatory Visit: Admitting: Physical Therapy

## 2023-12-08 ENCOUNTER — Ambulatory Visit: Admitting: Physical Therapy

## 2023-12-08 DIAGNOSIS — R2689 Other abnormalities of gait and mobility: Secondary | ICD-10-CM

## 2023-12-08 DIAGNOSIS — M533 Sacrococcygeal disorders, not elsewhere classified: Secondary | ICD-10-CM | POA: Diagnosis not present

## 2023-12-08 DIAGNOSIS — M5459 Other low back pain: Secondary | ICD-10-CM

## 2023-12-08 NOTE — Therapy (Signed)
 OUTPATIENT PHYSICAL THERAPY treatment  Patient Name: Donna Jefferson MRN: [id number] DOB:24-Oct-1940, 83 y.o., female Today's Date: 12/08/2023   PT End of Session - 12/08/23 1452     Visit Number 4    Number of Visits 10    Date for Recertification  01/18/24    PT Start Time 1415    PT Stop Time 1504    PT Time Calculation (min) 49 min    Activity Tolerance Patient tolerated treatment well;No increased pain    Behavior During Therapy WFL for tasks assessed/performed          Past Medical History:  Diagnosis Date   Actinic keratosis    Arthritis    osteoarthritis   GERD (gastroesophageal reflux disease)    Headache    migraines   Hx of squamous cell carcinoma    multiple sites   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 SCCis   Squamous cell skin cancer 11/02/2013   Left medial infraclavicular. SCCis, early evolving.   Squamous cell skin cancer 07/29/2018   Left distal pretibial. WD SCC with superficial infiltration.   Past Surgical History:  Procedure Laterality Date   ABDOMINAL HYSTERECTOMY     APPENDECTOMY     AUGMENTATION MAMMAPLASTY Bilateral 1980's   CATARACT EXTRACTION, BILATERAL     CHOLECYSTECTOMY     COLONOSCOPY     EYE SURGERY Bilateral    Cataract Extraction with IOL   JOINT REPLACEMENT Right    knee   KNEE ARTHROPLASTY Left 04/09/2016   Procedure: COMPUTER ASSISTED TOTAL KNEE ARTHROPLASTY;  Surgeon: Lynwood SHAUNNA Hue, MD;  Location: [facility];  Service: Orthopedics;  Laterality: Left;   KNEE ARTHROSCOPY Left 04/02/2015   Procedure: ARTHROSCOPY KNEE. 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  REFERRING PROVIDER: Schermerhorn   REFERRING DIAG: cystocele-midlinee  Rationale for Evaluation and Treatment Rehabilitation  THERAPY DIAG:  Sacrococcygeal disorders, not elsewhere classified  Other abnormalities of gait and mobility  Other low back pain  ONSET DATE:   SUBJECTIVE:           SUBJECTIVE STATEMENT TODAY:  Pt has been doing her exercise  SUBJECTIVE STATEMENT ON EVAL 11/09/23 : 1) vaginal bulge: pt noticed it until she is in the shower when she is washing herself. Pt is pushing it up. Af first when it first happened. It bothered her. Pt has adjusted to it mentally and has not noticed bothering her. Pt goes up and down stairs. Pt does not notice the prolapse worsening when lifting small items. Urination:  I feel like I got to go but I cannnot because bladder is in the way or she is trying to go a second Urination difficulty occurs  50% of the time . Pt strains 100% of the time with bowel movements. Stool consistency Type 1-2 . Takes probiotics/ Metamucil, eating fiber, drinks lots of water   2) low back pain located between low ribs- occurred for the last 6 months, after shopping 2-3 hours, doing dishes, 6-7/10 pain , uses cold pack for full  relief   3) L thigh numbness started this past summer.  It occurs after she has been up for a while, walking, cooking, . It appears suddenly. It eases with sitting. Numbness intensity 8/10 covers the top of her thigh above the knee to the side hip   4) Nocturia:  3-4 x night, has not had sleep study to rule out/ in OSA,    PERTINENT HISTORY:  2 vaginal deliveries , does not remember perineal scars   Abdominal hysterectomy Appendectomy Gall bladder removal   B TKA   Skin cancer   PAIN:  Are you having pain? Yes: see above   PRECAUTIONS: No  WEIGHT BEARING RESTRICTIONS: No  FALLS:  Has patient fallen in last 6 months? No   LIVING  ENVIRONMENT: Lives with: husband  Lives in: one story  Stairs: 3 STE with rail   OCCUPATION: retired runner, broadcasting/film/video , hobbies: reading, bible study, aquatic classes M, W, F, beach trips once a month,   PLOF: IND  PATIENT GOALS:  Low back pain resolves,  prolapse issue improve, decrease urination at night , decrease L thigh numbness improved     OBJECTIVE:   [facility] PT Assessment - 12/08/23 1452       Coordination   Coordination and Movement Description ab and chest mm oversue with deep core training      Palpation   SI assessment  levelled shoudlers and pelvis    Palpation comment deviated to R T3, L2, devited to L T12 with tenderness and hypomobility and limited intercostal mm at devuiated segments            [facility] Adult PT Treatment/Exercise - 12/08/23 1504       Neuro Re-ed    Neuro Re-ed Details  provided propioceptive cues and motor pattern sequence for neck ROM / scapular mobility, and deep core level 1-2 training  to minimzie ab / chest/ upper trap mm overuse and optimize diaphragm and deep core coordination, to minimize ab overuse      Manual Therapy   Manual therapy comments STM/MWM, PA mob Grade II, jostling and rocking to realign deviated segments noted in assessment  to optimize psoterior/ later excursion of diaphgram           HOME EXERCISE PROGRAM: See pt instruction section    ASSESSMENT:  CLINICAL IMPRESSION:  Pt showed levelled pelvis but presented with a few deviated thoracic segments and restricted posterior diaphragm movement. POst Tx, pt demo'd more medially aligned segments. Required  propioceptive cues and motor pattern sequence for  deep core level 1-2 training to optimize diaphragm excursion and deep core coordination, to minimize ab overuse     Plan to progress to  deep core level 2  training next session, add cervicoscapular/ thoracolumbar strengthening.  Anticipate these improvements will  to help promote optimize IAP system for improved pelvic  floor function, trunk stability, gait, balance, stabilization with mobility tasks.  Plan to address pelvic floor issues once pelvis and spine are realigned to yield better outcomes. Regional interdependent approaches will yield greater benefits in pt's POC due to the complexity of pt's medical Hx    Pt benefits from skilled PT to address these Sx:  1) vaginal bulge: 2) low back pain  3) L thigh numbness 4) Nocturia   OBJECTIVE IMPAIRMENTS decreased activity tolerance, decreased coordination, decreased endurance, decreased mobility, difficulty walking, decreased ROM, decreased strength, decreased safety awareness, hypomobility, increased muscle spasms, impaired flexibility, improper body mechanics, postural dysfunction, and pain. scar restrictions  ACTIVITY LIMITATIONS  self-care,  sleep, home chores, work tasks    PARTICIPATION LIMITATIONS:  community, cooking  activities,     PERSONAL FACTORS   affecting patient's functional outcome:    REHAB POTENTIAL: Good   CLINICAL DECISION MAKING: Evolving/moderate complexity   EVALUATION COMPLEXITY: Moderate    PATIENT EDUCATION:    Education details: Showed pt anatomy images. Explained muscles attachments/ connection, physiology of deep core system/ spinal- thoracic-pelvis-lower kinetic chain as they relate to pt's presentation, Sx, and past Hx. Explained what and how these areas of deficits need to be restored to balance and function    See Therapeutic activity / neuromuscular re-education section  Answered pt's questions.   Person educated: Patient Education method: Explanation, Demonstration, Tactile cues, Verbal cues, and Handouts Education comprehension: verbalized understanding, returned demonstration, verbal cues required, tactile cues required, and needs further education     PLAN: PT FREQUENCY: 1x/week   PT DURATION: 10 weeks   PLANNED INTERVENTIONS:   Gait training;Stair training;Functional mobility training;DME  Instruction;Therapeutic activities;Therapeutic exercise;Balance training;Neuromuscular re-education;Patient/family education;Vestibular;Visual/perceptual remediation/compensation;Passive range of motion;Moist Heat;Cryotherapy;Traction;Canalith Repostioning;Joint Manipulations;Manual lymph drainage;Manual techniques;Scar mobilization;Energy conservation;Dry needling;ADLs/Self Care Home Management;Biofeedback;Electrical Stimulation;Taping    PLAN FOR NEXT SESSION: See clinical impression for plan     GOALS: Goals reviewed with patient? Yes  SHORT TERM GOALS: Target date: 12/07/2023    Pt will demo IND with HEP                    Baseline: Not IND            Goal status: INITIAL   LONG TERM GOALS: Target date: 01/18/2024    1.Pt will demo proper deep core coordination without chest breathing and optimal excursion of diaphragm/pelvic floor in order to promote spinal stability and pelvic floor function  Baseline: dyscoordination Goal status: INITIAL  2.  Pt will demo proper body mechanics in against gravity tasks and ADLs  work tasks, fitness  to minimize straining pelvic floor / back    Baseline: not IND, improper form that places strain on pelvic floor  Goal status: INITIAL    3. Pt will demo increased gait speed > 1.3 m/s with reciprocal gait pattern, longer stride length  in order to ambulate safely in community and return to fitness routine  Baseline:  Goal status: INITIAL    4. Pt will demo levelled pelvic girdle and shoulder height in order to progress to deep core strengthening HEP and restore mobility at spine, pelvis, gait, posture minimize falls, and improve balance  Baseline:  Goal status: INITIAL   5. Pt will improve PFDI-7 questionnaire to  pts  score change  to demo improved QOL  Baseline:    ( greater pts indicate greater negative impact on QOL)   62  pts  ( total)  38   pts  ( UIQ-7 )  19  pts  ( CRAIQ-7 )  5   pts  ( POPIQ-7 )  Baseline:  Goal status:  INITIAL  6.  Pt will report able to urinate without needing to go a second time and or able to initiate urination without prolapse in the way across > 75% of the time  Baseline:  Urination:  I feel like I got to go but I cannnot because bladder is in the way or she is trying to go a second Urination difficulty occurs  50% of the time Goal status: INITIAL   7. Pt will report no more straining with bowel movements  and  report  Stool type 4   from _0% of the time to > 50% of the time, decrease type 1-2 from 100% of time to < 50% of the time  Baseline: straining 100% of the time  Goal status: INITIAL    8. Pt will communicate with MD about sleep study  Baseline: pt has not tested , nocturia 3-4 x night  Goal Status: INITIAL     9. Pt will report decreased numbness along L thigh from 8/10 to < 4/10 intensity while walking, cooking, Baseline: numbness along L thigh 8/10  walking, cooking, Goal Status: INITIAL      Pia Lupe Plump, PT 12/08/2023, 3:06 PM

## 2023-12-09 ENCOUNTER — Ambulatory Visit: Admitting: Physical Therapy

## 2023-12-14 ENCOUNTER — Ambulatory Visit: Admitting: Dermatology

## 2023-12-14 ENCOUNTER — Ambulatory Visit: Admitting: Physical Therapy

## 2023-12-14 DIAGNOSIS — D1801 Hemangioma of skin and subcutaneous tissue: Secondary | ICD-10-CM

## 2023-12-14 DIAGNOSIS — L821 Other seborrheic keratosis: Secondary | ICD-10-CM

## 2023-12-14 DIAGNOSIS — L719 Rosacea, unspecified: Secondary | ICD-10-CM

## 2023-12-14 DIAGNOSIS — Z1283 Encounter for screening for malignant neoplasm of skin: Secondary | ICD-10-CM | POA: Diagnosis not present

## 2023-12-14 DIAGNOSIS — L814 Other melanin hyperpigmentation: Secondary | ICD-10-CM

## 2023-12-14 DIAGNOSIS — Z8589 Personal history of malignant neoplasm of other organs and systems: Secondary | ICD-10-CM

## 2023-12-14 DIAGNOSIS — L578 Other skin changes due to chronic exposure to nonionizing radiation: Secondary | ICD-10-CM | POA: Diagnosis not present

## 2023-12-14 DIAGNOSIS — W908XXA Exposure to other nonionizing radiation, initial encounter: Secondary | ICD-10-CM

## 2023-12-14 DIAGNOSIS — Z85828 Personal history of other malignant neoplasm of skin: Secondary | ICD-10-CM

## 2023-12-14 DIAGNOSIS — L820 Inflamed seborrheic keratosis: Secondary | ICD-10-CM | POA: Diagnosis not present

## 2023-12-14 DIAGNOSIS — D229 Melanocytic nevi, unspecified: Secondary | ICD-10-CM

## 2023-12-14 NOTE — Progress Notes (Unsigned)
 Follow-Up Visit   Subjective  Donna Jefferson is a 83 y.o. female who presents for the following: Skin Cancer Screening and Full Body Skin Exam, hx of SCC. Hx of precancers.   The patient presents for Total-Body Skin Exam (TBSE) for skin cancer screening and mole check. The patient has spots, moles and lesions to be evaluated, some may be new or changing and the patient may have concern these could be cancer.  The following portions of the chart were reviewed this encounter and updated as appropriate: medications, allergies, medical history  Review of Systems:  No other skin or systemic complaints except as noted in HPI or Assessment and Plan.  Objective  Well appearing patient in no apparent distress; mood and affect are within normal limits.  A full examination was performed including scalp, head, eyes, ears, nose, lips, neck, chest, axillae, abdomen, back, buttocks, bilateral upper extremities, bilateral lower extremities, hands, feet, fingers, toes, fingernails, and toenails. All findings within normal limits unless otherwise noted below.   Relevant physical exam findings are noted in the Assessment and Plan.  right scalp x 1, right mandible x 1, right cheek x 1, left post shoulder x 1, sternum x 1, right forearm x 1 (6) Stuck-on, waxy, tan-brown papules and plaques -- Discussed benign etiology and prognosis.   Assessment & Plan   SKIN CANCER SCREENING PERFORMED TODAY.  ACTINIC DAMAGE - Chronic condition, secondary to cumulative UV/sun exposure - diffuse scaly erythematous macules with underlying dyspigmentation - Recommend daily broad spectrum sunscreen SPF 30+ to sun-exposed areas, reapply every 2 hours as needed.  - Staying in the shade or wearing long sleeves, sun glasses (UVA+UVB protection) and wide brim hats (4-inch brim around the entire circumference of the hat) are also recommended for sun protection.  - Call for new or changing lesions.  LENTIGINES, SEBORRHEIC  KERATOSES, HEMANGIOMAS - Benign normal skin lesions - Benign-appearing - Call for any changes  MELANOCYTIC NEVI - Tan-brown and/or pink-flesh-colored symmetric macules and papules - Benign appearing on exam today - Observation - Call clinic for new or changing moles - Recommend daily use of broad spectrum spf 30+ sunscreen to sun-exposed areas.   HISTORY OF SQUAMOUS CELL CARCINOMA OF THE SKIN - No evidence of recurrence today - No lymphadenopathy - Recommend regular full body skin exams - Recommend daily broad spectrum sunscreen SPF 30+ to sun-exposed areas, reapply every 2 hours as needed.  - Call if any new or changing lesions are noted between office visits   ROSACEA Exam Mid face erythema with telangiectasias Rosacea is a chronic progressive skin condition usually affecting the face of adults, causing redness and/or acne bumps. It is treatable but not curable. It sometimes affects the eyes (ocular rosacea) as well. It may respond to topical and/or systemic medication and can flare with stress, sun exposure, alcohol, exercise, topical steroids (including hydrocortisone/cortisone 10) and some foods.  Daily application of broad spectrum spf 30+ sunscreen to face is recommended to reduce flares. Treatment Plan No treatment   INFLAMED SEBORRHEIC KERATOSIS (6) right scalp x 1, right mandible x 1, right cheek x 1, left post shoulder x 1, sternum x 1, right forearm x 1 (6) Symptomatic, irritating, patient would like treated.  Destruction of lesion - right scalp x 1, right mandible x 1, right cheek x 1, left post shoulder x 1, sternum x 1, right forearm x 1 (6) Complexity: simple   Destruction method: cryotherapy   Informed consent: discussed and consent obtained  Timeout:  patient name, date of birth, surgical site, and procedure verified Lesion destroyed using liquid nitrogen: Yes   Region frozen until ice ball extended beyond lesion: Yes   Outcome: patient tolerated procedure well  with no complications   Post-procedure details: wound care instructions given      Return in about 1 year (around 12/13/2024) for TBSE, hx of SCC.  IFay Kirks, CMA, am acting as scribe for Alm Rhyme, MD .   Documentation: I have reviewed the above documentation for accuracy and completeness, and I agree with the above.  Alm Rhyme, MD

## 2023-12-14 NOTE — Patient Instructions (Addendum)

## 2023-12-15 ENCOUNTER — Encounter: Payer: Self-pay | Admitting: Dermatology

## 2023-12-17 ENCOUNTER — Ambulatory Visit: Admitting: Physical Therapy

## 2023-12-17 ENCOUNTER — Ambulatory Visit: Payer: Medicare PPO | Admitting: Dermatology

## 2023-12-17 DIAGNOSIS — M533 Sacrococcygeal disorders, not elsewhere classified: Secondary | ICD-10-CM | POA: Diagnosis not present

## 2023-12-17 DIAGNOSIS — R2689 Other abnormalities of gait and mobility: Secondary | ICD-10-CM

## 2023-12-17 DIAGNOSIS — M5459 Other low back pain: Secondary | ICD-10-CM

## 2023-12-17 NOTE — Therapy (Signed)
 OUTPATIENT PHYSICAL THERAPY treatment  Patient Name: Donna Jefferson MRN: [id number] DOB:August 10, 1940, 83 y.o., female Today's Date: 12/17/2023   PT End of Session - 12/17/23 1510     Visit Number 5    Number of Visits 10    Date for Recertification  01/18/24    PT Start Time 1505    PT Stop Time 1545    PT Time Calculation (min) 40 min    Activity Tolerance Patient tolerated treatment well;No increased pain    Behavior During Therapy WFL for tasks assessed/performed          Past Medical History:  Diagnosis Date   Actinic keratosis    Arthritis    osteoarthritis   GERD (gastroesophageal reflux disease)    Headache    migraines   Hx of squamous cell carcinoma    multiple sites   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 SCCis   Squamous cell skin cancer 11/02/2013   Left medial infraclavicular. SCCis, early evolving.   Squamous cell skin cancer 07/29/2018   Left distal pretibial. WD SCC with superficial infiltration.   Past Surgical History:  Procedure Laterality Date   ABDOMINAL HYSTERECTOMY     APPENDECTOMY     AUGMENTATION MAMMAPLASTY Bilateral 1980's   CATARACT EXTRACTION, BILATERAL     CHOLECYSTECTOMY     COLONOSCOPY     EYE SURGERY Bilateral    Cataract Extraction with IOL   JOINT REPLACEMENT Right    knee   KNEE ARTHROPLASTY Left 04/09/2016   Procedure: COMPUTER ASSISTED TOTAL KNEE ARTHROPLASTY;  Surgeon: Lynwood SHAUNNA Hue, MD;  Location: [facility];  Service: Orthopedics;  Laterality: Left;   KNEE ARTHROSCOPY Left 04/02/2015   Procedure: ARTHROSCOPY KNEE. 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  REFERRING PROVIDER: [facility]   REFERRING DIAG: cystocele-midlinee  Rationale for Evaluation and Treatment Rehabilitation  THERAPY DIAG:  Sacrococcygeal disorders, not elsewhere classified  Other abnormalities of gait and mobility  Other low back pain  ONSET DATE:   SUBJECTIVE:           SUBJECTIVE STATEMENT TODAY:  Pt felt sore after last session but it went away  Straining with urination decreased from 50% of the time to 25% of the time and with bowel movements from 100% to 50% of the  time  Pt does not experience a dribble after urinating the first time to urinating  L thigh numbness decreased from 8/10 pain intensity to 4/10 pain intensity LBP decreased from 6-7/10 to 3-4 /10 while  shopping 2-3 hours, doing dishes, 6-7/10 pain , uses cold pack for full relief   Nighttime voiding is not improved   I am mindful  of not slouching and pulling shoulders up.   SUBJECTIVE STATEMENT ON EVAL 11/09/23 : 1) vaginal bulge: pt noticed it until she is in the shower when she is washing herself. Pt is pushing it up. Af first when it first happened. It bothered her. Pt has adjusted to it mentally and has not noticed bothering her. Pt goes up  and down stairs. Pt does not notice the prolapse worsening when lifting small items. Urination:  I feel like I got to go but I cannnot because bladder is in the way or she is trying to go a second Urination difficulty occurs  50% of the time . Pt strains 100% of the time with bowel movements. Stool consistency Type 1-2 . Takes probiotics/ Metamucil, eating fiber, drinks lots of water   2) low back pain located between low ribs- occurred for the last 6 months, after shopping 2-3 hours, doing dishes, 6-7/10 pain , uses cold pack for full relief   3) L thigh numbness started this past summer.  It occurs after she has been up for a while, walking, cooking, . It  appears suddenly. It eases with sitting. Numbness intensity 8/10 covers the top of her thigh above the knee to the side hip   4) Nocturia:  3-4 x night, has not had sleep study to rule out/ in OSA,    PERTINENT HISTORY:  2 vaginal deliveries , does not remember perineal scars   Abdominal hysterectomy Appendectomy Gall bladder removal   B TKA   Skin cancer   PAIN:  Are you having pain? Yes: see above   PRECAUTIONS: No  WEIGHT BEARING RESTRICTIONS: No  FALLS:  Has patient fallen in last 6 months? No   LIVING ENVIRONMENT: Lives with: husband  Lives in: one story  Stairs: 3 STE with rail   OCCUPATION: retired runner, broadcasting/film/video , hobbies: reading, bible study, aquatic classes M, W, F, beach trips once a month,   PLOF: IND  PATIENT GOALS:  Low back pain resolves,  prolapse issue improve, decrease urination at night , decrease L thigh numbness improved     OBJECTIVE:    [facility] PT Assessment - 12/17/23 1546       Coordination   Coordination and Movement Description uper chest overuse with deep core level 1      Palpation   SI assessment  levelled shoudlers and pelvis          [facility] Adult PT Treatment/Exercise - 12/17/23 1546       Therapeutic Activites    Other Therapeutic Activities reassessed goals      Neuro Re-ed    Neuro Re-ed Details  provided propioceptive cues and motor planning with deep core level 1 and 2 , propioception cues for anterior tilt of pelvis and LKC             HOME EXERCISE PROGRAM: See pt instruction section    ASSESSMENT:  CLINICAL IMPRESSION:  Across the past 4 visits,  Improvements include:  Pt is no longer having to push up her bladder and it feels it is not as low as it was.  Straining with urination decreased from 50% of the time to 25% of the time and with bowel movements from 100% to 50% of the  time  Pt does not experience a dribble after urinating the first time to urinating  L thigh numbness decreased from 8/10 pain  intensity to 4/10 pain intensity LBP decreased from 6-7/10 to 3-4 /10 while  shopping 2-3 hours, doing dishes, 6-7/10 pain , uses cold pack for full relief   Pt has achieved more upright posture, levelled shoulders and pelvis. Pt is more aware of her body mechanics and posture        Progressed with  deep core level 2  training today. Provided propioceptive cues and motor planning with deep core level 1 and  2 , propioception cues for anterior tilt of pelvis and LKC . Pt demo'd correctly post Tx    Plan to  add cervicoscapular/ thoracolumbar strengthening next session  .  Anticipate these improvements will  to help promote optimize IAP system for improved pelvic floor function, trunk stability, gait, balance, stabilization with mobility tasks.  Plan to address pelvic floor issues once pelvis and spine are realigned to yield better outcomes. Regional interdependent approaches will yield greater benefits in pt's POC due to the complexity of pt's medical Hx    Pt benefits from skilled PT to address these Sx:  1) vaginal bulge: 2) low back pain  3) L thigh numbness 4) Nocturia   OBJECTIVE IMPAIRMENTS decreased activity tolerance, decreased coordination, decreased endurance, decreased mobility, difficulty walking, decreased ROM, decreased strength, decreased safety awareness, hypomobility, increased muscle spasms, impaired flexibility, improper body mechanics, postural dysfunction, and pain. scar restrictions   ACTIVITY LIMITATIONS  self-care,  sleep, home chores, work tasks    PARTICIPATION LIMITATIONS:  community, cooking  activities,     PERSONAL FACTORS   affecting patient's functional outcome:    REHAB POTENTIAL: Good   CLINICAL DECISION MAKING: Evolving/moderate complexity   EVALUATION COMPLEXITY: Moderate    PATIENT EDUCATION:    Education details: Showed pt anatomy images. Explained muscles attachments/ connection, physiology of deep core system/ spinal-  thoracic-pelvis-lower kinetic chain as they relate to pt's presentation, Sx, and past Hx. Explained what and how these areas of deficits need to be restored to balance and function    See Therapeutic activity / neuromuscular re-education section  Answered pt's questions.   Person educated: Patient Education method: Explanation, Demonstration, Tactile cues, Verbal cues, and Handouts Education comprehension: verbalized understanding, returned demonstration, verbal cues required, tactile cues required, and needs further education     PLAN: PT FREQUENCY: 1x/week   PT DURATION: 10 weeks   PLANNED INTERVENTIONS:   Gait training;Stair training;Functional mobility training;DME Instruction;Therapeutic activities;Therapeutic exercise;Balance training;Neuromuscular re-education;Patient/family education;Vestibular;Visual/perceptual remediation/compensation;Passive range of motion;Moist Heat;Cryotherapy;Traction;Canalith Repostioning;Joint Manipulations;Manual lymph drainage;Manual techniques;Scar mobilization;Energy conservation;Dry needling;ADLs/Self Care Home Management;Biofeedback;Electrical Stimulation;Taping    PLAN FOR NEXT SESSION: See clinical impression for plan     GOALS: Goals reviewed with patient? Yes  SHORT TERM GOALS: Target date: 12/07/2023    Pt will demo IND with HEP                    Baseline: Not IND            Goal status: INITIAL   LONG TERM GOALS: Target date: 01/18/2024    1.Pt will demo proper deep core coordination without chest breathing and optimal excursion of diaphragm/pelvic floor in order to promote spinal stability and pelvic floor function  Baseline: dyscoordination Goal status: INITIAL  2.  Pt will demo proper body mechanics in against gravity tasks and ADLs  work tasks, fitness  to minimize straining pelvic floor / back    Baseline: not IND, improper form that places strain on pelvic floor  Goal status: INITIAL    3. Pt will demo increased  gait speed > 1.3 m/s with reciprocal gait pattern, longer stride length  in order to ambulate safely in community and return to fitness routine  Baseline:  Goal status: INITIAL    4. Pt will demo levelled pelvic girdle and shoulder height in order to progress to deep core strengthening HEP and restore mobility at spine, pelvis, gait, posture minimize falls, and improve balance  Baseline:  Goal status: INITIAL   5.  Pt will improve PFDI-7 questionnaire to  pts  score change  to demo improved QOL  Baseline:    ( greater pts indicate greater negative impact on QOL)   62  pts  ( total)  38   pts  ( UIQ-7 )  19  pts  ( CRAIQ-7 )  5   pts  ( POPIQ-7 )  Baseline:  Goal status: INITIAL  6.  Pt will report able to urinate without needing to go a second time and or able to initiate urination without prolapse in the way across > 75% of the time  Baseline:  Urination:  I feel like I got to go but I cannnot because bladder is in the way or she is trying to go a second Urination difficulty occurs  50% of the time Goal status: INITIAL   7. Pt will report no more straining with bowel movements  and report  Stool type 4   from _0% of the time to > 50% of the time, decrease type 1-2 from 100% of time to < 50% of the time  Baseline: straining 100% of the time  Goal status: INITIAL    8. Pt will communicate with MD about sleep study  Baseline: pt has not tested , nocturia 3-4 x night  Goal Status: INITIAL     9. Pt will report decreased numbness along L thigh from 8/10 to < 4/10 intensity while walking, cooking, Baseline: numbness along L thigh 8/10  walking, cooking, Goal Status: INITIAL      Pia Lupe Plump, PT 12/17/2023, 3:10 PM

## 2023-12-20 ENCOUNTER — Encounter: Payer: Self-pay | Admitting: Emergency Medicine

## 2023-12-20 ENCOUNTER — Ambulatory Visit
Admission: EM | Admit: 2023-12-20 | Discharge: 2023-12-20 | Disposition: A | Discharge status: Home / Self Care | Attending: Emergency Medicine | Admitting: Emergency Medicine

## 2023-12-20 DIAGNOSIS — N390 Urinary tract infection, site not specified: Secondary | ICD-10-CM | POA: Insufficient documentation

## 2023-12-20 DIAGNOSIS — R319 Hematuria, unspecified: Secondary | ICD-10-CM | POA: Insufficient documentation

## 2023-12-20 DIAGNOSIS — R300 Dysuria: Secondary | ICD-10-CM | POA: Insufficient documentation

## 2023-12-20 LAB — POCT URINE DIPSTICK
Glucose, UA: 100 mg/dL — AB
Nitrite, UA: POSITIVE — AB
POC PROTEIN,UA: 100 — AB
Spec Grav, UA: 1.015 (ref 1.010–1.025)
Urobilinogen, UA: 1 U/dL
pH, UA: 6 (ref 5.0–8.0)

## 2023-12-20 MED ORDER — PHENAZOPYRIDINE HCL 200 MG PO TABS
200.0000 mg | ORAL_TABLET | Freq: Three times a day (TID) | ORAL | 0 refills | Status: AC | PRN
Start: 2023-12-20 — End: ?

## 2023-12-20 MED ORDER — CEPHALEXIN 500 MG PO CAPS: 500.0000 mg | ORAL_CAPSULE | Freq: Two times a day (BID) | ORAL | 0 refills | Status: AC

## 2023-12-20 NOTE — Discharge Instructions (Signed)
 Finish the Keflex , even if you feel better.  The COVID will turn your urine orange, but will help with your symptoms.  May take Tylenol  1000 mg 3-4 times a day as needed for pain.  Make sure you push plenty of extra fluids.  Go to the ER for the signs and symptoms we discussed

## 2023-12-20 NOTE — ED Triage Notes (Signed)
 Pt c/o dysuria, urinae odor, urinary frequency. Started yesterday. She has taken AZO yesterday. She states she was up last night urinating. Denies fever or back pain.

## 2023-12-20 NOTE — ED Provider Notes (Signed)
 HPI  SUBJECTIVE:  Donna Jefferson is a 83 y.o. female who presents with dysuria, urgency, frequency, odorous urine starting yesterday.  No cloudy urine, hematuria.  No nausea, vomiting, fevers, abdominal, back, pelvic pain.  No vaginal odor, bleeding, itching, genital rash or discharge.  She is in a long-term monogamous relationship with her husband, who is asymptomatic.  STDs are not a concern today.  No antipyretic in the past 6 hours.  No antibiotics in the past month.  She tried Azo and leftover tramadol .  The Azo helped.  Symptoms worse with urination.  She has a past medical history of UTI, prolapsed bladder getting pelvic PT.  No history of pyelonephritis, nephrolithiasis, diabetes, chronic kidney disease, STDs, BV or yeast infections.  [facility]  Last urine culture in 2019, 2022 grew out Citrobacter that was sensitive to first generation, third-generation cephalosporins, Cipro, Macrobid, Bactrim.  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 SCCis   Squamous cell skin cancer 11/02/2013   Left medial infraclavicular. SCCis, early evolving.   Squamous cell skin cancer 07/29/2018   Left distal pretibial. WD SCC with superficial infiltration.    Past Surgical History:  Procedure Laterality Date   ABDOMINAL HYSTERECTOMY     APPENDECTOMY     AUGMENTATION MAMMAPLASTY Bilateral 1980's   CATARACT EXTRACTION, BILATERAL     CHOLECYSTECTOMY     COLONOSCOPY     EYE SURGERY Bilateral    Cataract Extraction with IOL   JOINT REPLACEMENT Right    knee   KNEE ARTHROPLASTY Left 04/09/2016   Procedure: COMPUTER ASSISTED TOTAL KNEE ARTHROPLASTY;  Surgeon: Lynwood SHAUNNA Hue, MD;  Location: [facility];  Service: Orthopedics;  Laterality: Left;   KNEE  ARTHROSCOPY Left 04/02/2015   Procedure: ARTHROSCOPY KNEE. PARTIAL MEDIAL MENISECTOMY, MEDIAL CHONDROPLASTY;  Surgeon: Lynwood SHAUNNA Hue, MD;  Location: [facility];  Service: Orthopedics;  Laterality: Left;   TONSILLECTOMY     TRIGGER FINGER RELEASE Right    thumb    Family History  Problem Relation Age of Onset   Breast cancer Neg Hx     Social History   Tobacco Use   Smoking status: Never   Smokeless tobacco: Never  Vaping Use   Vaping status: Never Used  Substance Use Topics   Alcohol use: Yes    Comment: wine occ   Drug use: No    No current facility-administered medications for this encounter.  Current Outpatient Medications:    ALPRAZolam  (XANAX ) 0.25 MG tablet, Take 0.25 mg by mouth at bedtime., Disp: , Rfl:    atenolol  (TENORMIN ) 25 MG tablet, Take 25 mg by mouth at bedtime., Disp: , Rfl:    BIOTIN  PO, Take 1 tablet by mouth 2 (two) times daily., Disp: , Rfl:    Calcium  Carbonate-Vit D-Min (CALTRATE 600+D PLUS PO), Take 1 tablet by mouth 2 (two) times daily., Disp: , Rfl:    cephALEXin  (KEFLEX ) 500 MG capsule, Take 1 capsule (500 mg total) by mouth 2 (two) times daily., Disp: 14 capsule, Rfl: 0   Glucosamine-Chondroitin (GLUCOSAMINE CHONDR COMPLEX PO), Take 1 tablet by mouth 2 (two) times daily. , Disp: , Rfl:    omeprazole (PRILOSEC) 20 MG capsule, Take 20 mg by mouth daily.,  Disp: , Rfl:    phenazopyridine  (PYRIDIUM ) 200 MG tablet, Take 1 tablet (200 mg total) by mouth 3 (three) times daily as needed for pain., Disp: 6 tablet, Rfl: 0   Probiotic Product (ALIGN PO), Take 1 capsule by mouth daily. , Disp: , Rfl:    SUMAtriptan  (IMITREX ) 100 MG tablet, Take 100 mg by mouth every 2 (two) hours as needed for migraine. May repeat in 2 hours if headache persists or recurs., Disp: , Rfl:    amLODipine (NORVASC) 5 MG tablet, Take by mouth., Disp: , Rfl:    COVID-19 mRNA bivalent vaccine, Pfizer, (PFIZER COVID-19 VAC BIVALENT) injection, Inject into the muscle., Disp: 0.3 mL, Rfl: 0    COVID-19 mRNA Vac-TriS, Pfizer, (PFIZER-BIONT COVID-19 VAC-TRIS) SUSP injection, Inject into the muscle., Disp: 0.3 mL, Rfl: 0   Dermatological Products, Misc. (NUVAIL) SOLN, Apply 1 application topically at bedtime., Disp: 15 mL, Rfl: 6   Misc Natural Products (GRAPE SEED COMPLEX PO), Take 1 capsule by mouth daily with lunch. , Disp: , Rfl:    Misc Natural Products (TART CHERRY ADVANCED PO), Take 1,200 mg by mouth daily., Disp: , Rfl:    mometasone  (ELOCON ) 0.1 % cream, Apply twice daily to affected area on back as needed for itching. Avoid applying to face, groin, and axilla. Use as directed. Long-term use can cause thinning of the skin., Disp: 45 g, Rfl: 1   Multiple Vitamin (MULTIVITAMIN WITH MINERALS) TABS tablet, Take 1 tablet by mouth daily. Women's One-A-Day, Disp: , Rfl:    Polyethyl Glycol-Propyl Glycol (SYSTANE OP), Place 1 drop into both eyes 2 (two) times daily. , Disp: , Rfl:    psyllium (HYDROCIL/METAMUCIL) 95 % PACK, Take 1 packet by mouth at bedtime., Disp: , Rfl:    traMADol  (ULTRAM ) 50 MG tablet, Take 1-2 tablets (50-100 mg total) by mouth every 4 (four) hours as needed for moderate pain., Disp: 60 tablet, Rfl: 0  Allergies  Allergen Reactions   Codeine Nausea Only   Meloxicam Nausea And Vomiting   Sulfa Antibiotics Rash   Venlafaxine Hcl Rash   Vioxx [Rofecoxib] Rash     ROS  As noted in HPI.   Physical Exam  BP (!) 149/81 (BP Location: Right Arm)   Pulse 70   Temp 97.7 F (36.5 C) (Oral)   Resp 16   Ht 5' 4 (1.626 m)   Wt 71.8 kg   SpO2 94%   BMI 27.17 kg/m   Constitutional: Well developed, well nourished, no acute distress Eyes:  EOMI, conjunctiva normal bilaterally HENT: Normocephalic, atraumatic,mucus membranes moist Respiratory: Normal inspiratory effort Cardiovascular: Normal rate GI: nondistended.  No suprapubic, flank tenderness Back: No CVAT skin: No rash, skin intact Musculoskeletal: no deformities Neurologic: Alert & oriented x 3, no  focal neuro deficits Psychiatric: Speech and behavior appropriate   ED Course   Medications - No data to display  Orders Placed This Encounter  Procedures   Urine Culture    Standing Status:   Standing    Number of Occurrences:   1    Indication:   Urgency/frequency    Patient immune status:   Normal    Release to patient:   Immediate [1]   POC Urinalysis Dipstick    Standing Status:   Standing    Number of Occurrences:   1    Results for orders placed or performed during the hospital encounter of 12/20/23 (from the past 24 hours)  POC Urinalysis Dipstick     Status: Abnormal  Collection Time: 12/20/23  8:41 AM  Result Value Ref Range   Color, UA orange (A) yellow   Clarity, UA cloudy (A) clear   Glucose, UA =100 (A) negative mg/dL   Bilirubin, UA small (A) negative   Ketones, POC UA trace (5) (A) negative mg/dL   Spec Grav, UA 8.984 8.989 - 1.025   Blood, UA moderate (A) negative   pH, UA 6.0 5.0 - 8.0   POC PROTEIN,UA =100 (A) negative, trace   Urobilinogen, UA 1.0 0.2 or 1.0 E.U./dL   Nitrite, UA Positive (A) Negative   Leukocytes, UA Large (3+) (A) Negative   No results found.  ED Clinical Impression  1. Urinary tract infection with hematuria, site unspecified   2. Dysuria      ED Assessment/Plan      Previous labs and outside reviewed.  As noted in HPI.  Urinalysis with trace ketones, moderate RBCs, positive nitrites and large leukocytes.  Calculated creatinine clearance from labs done in September 2025 53 mL/min  Presentation consistent with a urinary tract infection.  Will send urine off for culture to confirm antibiotic choice.  She has no vaginal complaints.  STDs are not a concern today.  Will send home with Keflex  500 mg p.o. twice daily for 7 days, Pyridium .  This does not need to be renally adjusted per up-to-date, push fluids.  ER return precautions given.  Discussed labs, MDM, treatment plan, and plan for follow-up with patient. Discussed  sn/sx that should prompt return to the ED. patient agrees with plan.   Meds ordered this encounter  Medications   cephALEXin  (KEFLEX ) 500 MG capsule    Sig: Take 1 capsule (500 mg total) by mouth 2 (two) times daily.    Dispense:  14 capsule    Refill:  0   phenazopyridine  (PYRIDIUM ) 200 MG tablet    Sig: Take 1 tablet (200 mg total) by mouth 3 (three) times daily as needed for pain.    Dispense:  6 tablet    Refill:  0      *This clinic note was created using Scientist, clinical (histocompatibility and immunogenetics). Therefore, there may be occasional mistakes despite careful proofreading.  ?    Van Knee, MD 12/20/23 0900

## 2023-12-21 ENCOUNTER — Encounter: Payer: Self-pay | Admitting: Physical Therapy

## 2023-12-21 ENCOUNTER — Ambulatory Visit: Admitting: Physical Therapy

## 2023-12-21 DIAGNOSIS — M5459 Other low back pain: Secondary | ICD-10-CM

## 2023-12-21 DIAGNOSIS — M533 Sacrococcygeal disorders, not elsewhere classified: Secondary | ICD-10-CM | POA: Diagnosis not present

## 2023-12-21 DIAGNOSIS — R2689 Other abnormalities of gait and mobility: Secondary | ICD-10-CM

## 2023-12-21 NOTE — Patient Instructions (Signed)
  ZigZag stretch  Reclined twist for hips and side of the hips/ legs  Lay on your back, knees bend, dig elbows and feet into bed to exhale and lift buttocks up to  Scoot hips and feet  to the L , leave shoulders in place Wobble knees to the R side 45 deg and to midline  10 reps   Then reach for R thigh with R hand cross body, straighten knee and point toes toward face, bend knee and repeat straigthen 10 reps to lengthen hamstring and ITband (side of hip)   Repeat on other side: Scoot hip and feet  R ,  leave shoulders in place Wobble knees to the L side 45 deg and to midline  10 reps   Then reach for L thigh with R hand cross body, straighten knee and point toes toward face, bend knee and repeat straigthen 10 reps to lengthen hamstring and ITband (side of hip)   __

## 2023-12-21 NOTE — Therapy (Signed)
 OUTPATIENT PHYSICAL THERAPY treatment  Patient Name: Donna Jefferson MRN: [id number] DOB:April 25, 1940, 83 y.o., female Today's Date: 12/21/2023   PT End of Session - 12/21/23 1112     Visit Number 6    Number of Visits 10    Date for Recertification  01/18/24    PT Start Time 1110    PT Stop Time 1150    PT Time Calculation (min) 40 min    Activity Tolerance Patient tolerated treatment well;No increased pain    Behavior During Therapy WFL for tasks assessed/performed          Past Medical History:  Diagnosis Date   Actinic keratosis    Arthritis    osteoarthritis   GERD (gastroesophageal reflux disease)    Headache    migraines   Hx of squamous cell carcinoma    multiple sites   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 SCCis   Squamous cell skin cancer 11/02/2013   Left medial infraclavicular. SCCis, early evolving.   Squamous cell skin cancer 07/29/2018   Left distal pretibial. WD SCC with superficial infiltration.   Past Surgical History:  Procedure Laterality Date   ABDOMINAL HYSTERECTOMY     APPENDECTOMY     AUGMENTATION MAMMAPLASTY Bilateral 1980's   CATARACT EXTRACTION, BILATERAL     CHOLECYSTECTOMY     COLONOSCOPY     EYE SURGERY Bilateral    Cataract Extraction with IOL   JOINT REPLACEMENT Right    knee   KNEE ARTHROPLASTY Left 04/09/2016   Procedure: COMPUTER ASSISTED TOTAL KNEE ARTHROPLASTY;  Surgeon: Lynwood SHAUNNA Hue, MD;  Location: [facility];  Service: Orthopedics;  Laterality: Left;   KNEE ARTHROSCOPY Left 04/02/2015   Procedure: ARTHROSCOPY KNEE. 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  REFERRING PROVIDER: Schermerhorn   REFERRING DIAG: cystocele-midlinee  Rationale for Evaluation and Treatment Rehabilitation  THERAPY DIAG:  Sacrococcygeal disorders, not elsewhere classified  Other abnormalities of gait and mobility  Other low back pain  ONSET DATE:   SUBJECTIVE:           SUBJECTIVE STATEMENT TODAY:  Pt  reported she noticed one ocassion where she had radiating pain along L thigh last week but it was not as intense when it occurred ( 5/10 instead 6-7/10 pain)  Pt currently is taking antibiotic for UTI  SUBJECTIVE STATEMENT ON EVAL 11/09/23 : 1) vaginal bulge: pt noticed it until she is in the shower when she is washing herself. Pt is pushing it up. Af first when it first happened. It bothered her. Pt has adjusted to it mentally and has not noticed bothering her. Pt goes up and down stairs. Pt does not notice the prolapse worsening when lifting small items. Urination:  I feel like I got to go but I cannnot because bladder is in the way or she is trying to go a second Urination difficulty occurs  50% of the time . Pt strains 100% of the time with bowel movements. Stool consistency Type 1-2 . Takes probiotics/ Metamucil, eating fiber, drinks  lots of water   2) low back pain located between low ribs- occurred for the last 6 months, after shopping 2-3 hours, doing dishes, 6-7/10 pain , uses cold pack for full relief   3) L thigh numbness started this past summer.  It occurs after she has been up for a while, walking, cooking, . It appears suddenly. It eases with sitting. Numbness intensity 8/10 covers the top of her thigh above the knee to the side hip   4) Nocturia:  3-4 x night, has not had sleep study to rule out/ in OSA,    PERTINENT HISTORY:  2 vaginal deliveries , does not remember perineal scars   Abdominal hysterectomy Appendectomy Gall bladder removal   B TKA   Skin  cancer   PAIN:  Are you having pain? Yes: see above   PRECAUTIONS: No  WEIGHT BEARING RESTRICTIONS: No  FALLS:  Has patient fallen in last 6 months? No   LIVING ENVIRONMENT: Lives with: husband  Lives in: one story  Stairs: 3 STE with rail   OCCUPATION: retired runner, broadcasting/film/video , hobbies: reading, bible study, aquatic classes M, W, F, beach trips once a month,   PLOF: IND  PATIENT GOALS:  Low back pain resolves,  prolapse issue improve, decrease urination at night , decrease L thigh numbness improved     OBJECTIVE:     [facility] PT Assessment - 12/21/23 1155       Strength   Overall Strength Comments L hip abd 3/5      Palpation   SI assessment  levelled shoudlers and pelvis    Palpation comment hypomobile and tendenress at L SIJ, iliac crest attachments,          [facility] Adult PT Treatment/Exercise - 12/21/23 1224       Self-Care   Other Self-Care Comments  explained and showed anatomy images of deep core system, explained details to internal vaginal assessment and to be performed after UTI clears up, explained external assesment of pelvic floor, explaiend the role of past abdominal surgeries to abdominal wall related to deep core, explained deep core funciton for improving prolapse and R LBP/ radiating thigh pain, and improved emptying of urine and bowels with less straining   Discussed f.u with PCP re: sleep study to rule in/ out OSA . Pt agreed. Pt read the articles re: nocturia and OSA.      Neuro Re-ed    Neuro Re-ed Details  provided propiocetive cues for L SIJ stretch to minimize L radiating LBP      Manual Therapy   Manual therapy comments rotational mob and STM/MWM at problem areas noted in assesment to promote L SIJ mobility               HOME EXERCISE PROGRAM: See pt instruction section    ASSESSMENT:  CLINICAL IMPRESSION:  Across the past  visits,  Improvements include:  Pt is no longer having to push up her bladder and it feels it is not as  low as it was.  Straining with urination decreased from 50% of the time to 25% of the time and with bowel movements from 100% to 50% of the  time  Pt does not experience a dribble after urinating the first time to urinating  L thigh numbness decreased from 8/10 pain intensity to 4/10 pain intensity LBP decreased from 6-7/10 to 3-4 /10 while  shopping 2-3 hours, doing dishes, 6-7/10 pain , uses cold pack for full relief   Pt has  achieved more upright posture, levelled shoulders and pelvis. Pt is more aware of her body mechanics and posture   Skilled PT will further help with remaining goals to help improve complete emptying of urine and bowels without straining and improve L radiating thigh pain from low back.    Today explained and showed anatomy images of deep core system, explained details to internal vaginal assessment and to be performed after UTI clears up, explained external assesment of pelvic floor, explained the role of past abdominal surgeries to abdominal wall related to deep core, explained deep core funciton for improving prolapse and R LBP/ radiating thigh pain, and improved emptying of urine and bowels with less straining .    Discussed f.u with PCP re: sleep study to rule in/ out OSA . Pt agreed. Pt read the articles re: nocturia and OSA.   Manual Tx was applied to improve L SIJ mobility, Modified technique to accommodate comfort.  Provided propiocetive cues for L SIJ stretch to minimize L radiating LBP. Pt demo'd correctly post training.   Plan to external pelvic floor assessment and  add cervicoscapular/ thoracolumbar strengthening next session  .  Anticipate these improvements will  to help promote optimize IAP system for improved pelvic floor function, trunk stability, gait, balance, stabilization with mobility tasks. Regional interdependent approaches will yield greater benefits in pt's POC due to the complexity of pt's medical Hx    Pt benefits from skilled PT to address  these Sx:  1) vaginal bulge: 2) low back pain  3) L thigh numbness 4) Nocturia   OBJECTIVE IMPAIRMENTS decreased activity tolerance, decreased coordination, decreased endurance, decreased mobility, difficulty walking, decreased ROM, decreased strength, decreased safety awareness, hypomobility, increased muscle spasms, impaired flexibility, improper body mechanics, postural dysfunction, and pain. scar restrictions   ACTIVITY LIMITATIONS  self-care,  sleep, home chores, work tasks    PARTICIPATION LIMITATIONS:  community, cooking  activities,     PERSONAL FACTORS   affecting patient's functional outcome:    REHAB POTENTIAL: Good   CLINICAL DECISION MAKING: Evolving/moderate complexity   EVALUATION COMPLEXITY: Moderate    PATIENT EDUCATION:    Education details: Showed pt anatomy images. Explained muscles attachments/ connection, physiology of deep core system/ spinal- thoracic-pelvis-lower kinetic chain as they relate to pt's presentation, Sx, and past Hx. Explained what and how these areas of deficits need to be restored to balance and function    See Therapeutic activity / neuromuscular re-education section  Answered pt's questions.   Person educated: Patient Education method: Explanation, Demonstration, Tactile cues, Verbal cues, and Handouts Education comprehension: verbalized understanding, returned demonstration, verbal cues required, tactile cues required, and needs further education     PLAN: PT FREQUENCY: 1x/week   PT DURATION: 10 weeks   PLANNED INTERVENTIONS:   Gait training;Stair training;Functional mobility training;DME Instruction;Therapeutic activities;Therapeutic exercise;Balance training;Neuromuscular re-education;Patient/family education;Vestibular;Visual/perceptual remediation/compensation;Passive range of motion;Moist Heat;Cryotherapy;Traction;Canalith Repostioning;Joint Manipulations;Manual lymph drainage;Manual techniques;Scar mobilization;Energy  conservation;Dry needling;ADLs/Self Care Home Management;Biofeedback;Electrical Stimulation;Taping    PLAN FOR NEXT SESSION: See clinical impression for plan     GOALS: Goals reviewed with patient? Yes  SHORT TERM GOALS: Target date: 12/07/2023    Pt will demo IND with HEP                    Baseline: Not IND            Goal status: INITIAL   LONG TERM GOALS: Target date: 01/18/2024    1.Pt will demo proper deep core coordination without chest breathing and  optimal excursion of diaphragm/pelvic floor in order to promote spinal stability and pelvic floor function  Baseline: dyscoordination Goal status:  MET   2.  Pt will demo proper body mechanics in against gravity tasks and ADLs  work tasks, fitness  to minimize straining pelvic floor / back    Baseline: not IND, improper form that places strain on pelvic floor  Goal status: MET     3. Pt will demo increased gait speed > 1.3 m/s with reciprocal gait pattern, longer stride length  in order to ambulate safely in community and return to fitness routine  Baseline: 1.24 m/s, pelvic shift to L, limited L thoracic posterior rotation  Goal status:  MET   ( 12/21/23:  1.45 m/s , reciprocal )     4. Pt will demo levelled pelvic girdle and shoulder height in order to progress to deep core strengthening HEP and restore mobility at spine, pelvis, gait, posture minimize falls, and improve balance  Baseline: L shoulder, R iliac crest higher, thoracic shift to R  Goal status: MET    5. Pt will improve PFDI-7 questionnaire to  pts  score change  to demo improved QOL  Baseline:    ( greater pts indicate greater negative impact on QOL)   62  pts  ( total)  _ > 29   38   pts  ( UIQ-7 )   --> 24  19  pts  ( CRAIQ-7 )  --> 5   5   pts  ( POPIQ-7 )  -->  0   Baseline:  Goal status: MET    6.  Pt will report able to urinate without needing to go a second time and or able to initiate urination without prolapse in the way across > 75% of  the time  Baseline:  Urination:  I feel like I got to go but I cannnot because bladder is in the way or she is trying to go a second Urination difficulty occurs  50% of the time Goal status:  MET      7. Pt will report no more straining with bowel movements  and report  Stool type 4   from _0% of the time to > 50% of the time, decrease type 1-2 from 100% of time to < 50% of the time  Baseline: straining 100% of the time  Goal status:  Partially MET ( 12/21/23:  straining 40% of time instead 100%  of the time)    8. Pt will communicate with MD about sleep study  Baseline: pt has not tested , nocturia 3-4 x night  Goal Status: MET,  ( 12/21/23: pt read the articles therapist provided, pt voiced she will f/u )   9. Pt will report decreased numbness along L thigh from 8/10 to < 4/10 intensity while walking, cooking, Baseline: numbness along L thigh 8/10  walking, cooking, Goal Status: Partially MET  ( 12/21/23: not as often but at intensity 5/10 instead of 8/10 occurring after standing. Cooking 45 min )    Pia Lupe Plump, PT 12/21/2023, 11:16 AM

## 2023-12-23 ENCOUNTER — Ambulatory Visit: Payer: Self-pay

## 2023-12-23 LAB — URINE CULTURE
Culture: 50000 — AB
Special Requests: NORMAL

## 2023-12-28 ENCOUNTER — Ambulatory Visit: Admitting: Physical Therapy

## 2023-12-28 DIAGNOSIS — M5459 Other low back pain: Secondary | ICD-10-CM | POA: Insufficient documentation

## 2023-12-28 DIAGNOSIS — R2689 Other abnormalities of gait and mobility: Secondary | ICD-10-CM | POA: Diagnosis present

## 2023-12-28 DIAGNOSIS — M533 Sacrococcygeal disorders, not elsewhere classified: Secondary | ICD-10-CM | POA: Insufficient documentation

## 2023-12-28 NOTE — Patient Instructions (Signed)
 Deep core level 1-2 with semi reclined positoin with pillow placed lengthwise and one horiztonal  Pelvic tilt forward, four points of the feet   Less ab muscles

## 2023-12-28 NOTE — Therapy (Signed)
 OUTPATIENT PHYSICAL THERAPY treatment  Patient Name: Donna Jefferson MRN: [id number] DOB:1940-10-27, 83 y.o., female Today's Date: 12/28/2023   PT End of Session - 12/28/23 1145     Visit Number 7    Number of Visits 10    Date for Recertification  01/18/24    PT Start Time 1105    PT Stop Time 1150    PT Time Calculation (min) 45 min    Activity Tolerance Patient tolerated treatment well;No increased pain    Behavior During Therapy WFL for tasks assessed/performed          Past Medical History:  Diagnosis Date   Actinic keratosis    Arthritis    osteoarthritis   GERD (gastroesophageal reflux disease)    Headache    migraines   Hx of squamous cell carcinoma    multiple sites   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 SCCis   Squamous cell skin cancer 11/02/2013   Left medial infraclavicular. SCCis, early evolving.   Squamous cell skin cancer 07/29/2018   Left distal pretibial. WD SCC with superficial infiltration.   Past Surgical History:  Procedure Laterality Date   ABDOMINAL HYSTERECTOMY     APPENDECTOMY     AUGMENTATION MAMMAPLASTY Bilateral 1980's   CATARACT EXTRACTION, BILATERAL     CHOLECYSTECTOMY     COLONOSCOPY     EYE SURGERY Bilateral    Cataract Extraction with IOL   JOINT REPLACEMENT Right    knee   KNEE ARTHROPLASTY Left 04/09/2016   Procedure: COMPUTER ASSISTED TOTAL KNEE ARTHROPLASTY;  Surgeon: Lynwood SHAUNNA Hue, MD;  Location: [facility] ORS;  Jefferson: Orthopedics;  Laterality: Left;   KNEE ARTHROSCOPY Left 04/02/2015   Procedure: ARTHROSCOPY KNEE. 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  REFERRING PROVIDER: Schermerhorn   REFERRING DIAG: cystocele-midlinee  Rationale for Evaluation and Treatment Rehabilitation  THERAPY DIAG:  Sacrococcygeal disorders, not elsewhere classified  Other abnormalities of gait and mobility  Other low back pain  ONSET DATE:   SUBJECTIVE:           SUBJECTIVE STATEMENT TODAY:  Pt  reported she noticed one ocassion where she had radiating pain along L thigh last week but it was not as intense when it occurred ( 5/10 instead 6-7/10 pain)  Pt currently is taking antibiotic for UTI  SUBJECTIVE STATEMENT ON EVAL 11/09/23 : 1) vaginal bulge: pt noticed it until she is in the shower when she is washing herself. Pt is pushing it up. Af first when it first happened. It bothered her. Pt has adjusted to it mentally and has not noticed bothering her. Pt goes up and down stairs. Pt does not notice the prolapse worsening when lifting small items. Urination:  I feel like I got to go but I cannnot because bladder is in the way or she is trying to go a second Urination difficulty occurs  50% of the time . Pt strains 100% of the time with bowel movements. Stool consistency Type 1-2 . Takes probiotics/ Metamucil, eating fiber, drinks  lots of water   2) low back pain located between low ribs- occurred for the last 6 months, after shopping 2-3 hours, doing dishes, 6-7/10 pain , uses cold pack for full relief   3) L thigh numbness started this past summer.  It occurs after she has been up for a while, walking, cooking, . It appears suddenly. It eases with sitting. Numbness intensity 8/10 covers the top of her thigh above the knee to the side hip   4) Nocturia:  3-4 x night, has not had sleep study to rule out/ in OSA,    PERTINENT HISTORY:  2 vaginal deliveries , does not remember perineal scars   Abdominal hysterectomy Appendectomy Gall bladder removal   B TKA   Skin  cancer   PAIN:  Are you having pain? Yes: see above   PRECAUTIONS: No  WEIGHT BEARING RESTRICTIONS: No  FALLS:  Has patient fallen in last 6 months? No   LIVING ENVIRONMENT: Lives with: husband  Lives in: one story  Stairs: 3 STE with rail   OCCUPATION: retired runner, broadcasting/film/video , hobbies: reading, bible study, aquatic classes M, W, F, beach trips once a month,   PLOF: IND  PATIENT GOALS:  Low back pain resolves,  prolapse issue improve, decrease urination at night , decrease L thigh numbness improved     OBJECTIVE:   [facility] PT Assessment - 12/28/23 1145       Coordination   Coordination and Movement Description ab overuse with deep core  HEP      Palpation   Palpation comment hypomobile and tenderness along paraspinals R cervical-thoracic, medial scapular area , intercostal limitation T10-12 anterior/lateral  ,          [facility] Adult PT Treatment/Exercise - 12/28/23 1147       Self-Care   Other Self-Care Comments  placed pillow longwise to perform deep core level 1-2 in semi reclined positoni and was explained to maintain porper alignment to help minimzie dyscoordination of deep core ( minimize upper and chest and ab oversue)      Neuro Re-ed    Neuro Re-ed Details  provided tactile cues and motor planning cues for proper coordinaiton of deep core to minimzie ab , chest/ upper trap overuse      Manual Therapy   Manual therapy comments STM/MWM at problem areas noted in asessment ot minimize tightness  at paraspinals, low ab/ anterior pelvic floor through jeans               HOME EXERCISE PROGRAM: See pt instruction section    ASSESSMENT:  CLINICAL IMPRESSION:  Across the past  visits,  Improvements include:  Pt is no longer having to push up her bladder and it feels it is not as low as it was.  Straining with urination decreased from 50% of the time to 25% of the time and with bowel movements from 100% to 50% of the  time  Pt does not experience a dribble  after urinating the first time to urinating  L thigh numbness decreased from 8/10 pain intensity to 4/10 pain intensity LBP decreased from 6-7/10 to 3-4 /10 while  shopping 2-3 hours, doing dishes, 6-7/10 pain , uses cold pack for full relief   Pt has achieved more upright posture, levelled shoulders and pelvis. Pt is more aware of her body mechanics and posture   Skilled PT will further help with remaining goals to help improve complete emptying of urine and bowels without straining and improve L  radiating thigh pain from low back.   Today Manual Tx was applied to problem areas noted in asessment ot minimize tightness  at paraspinals, low ab/ anterior pelvic floor through jeans . Provided tactile cues and motor planning cues for proper coordinaiton of deep core to minimzie ab , chest/ upper trap overuse.    Plan to assess R diaphragm excursion to optimize deep core function and progress wihh external pelvic floor assessment and  add cervicoscapular/ thoracolumbar strengthening next session  .  Anticipate these improvements will  to help promote optimize IAP system for improved pelvic floor function, trunk stability, gait, balance, stabilization with mobility tasks. Regional interdependent approaches will yield greater benefits in pt's POC due to the complexity of pt's medical Hx    Pt benefits from skilled PT to address these Sx:  1) vaginal bulge: 2) low back pain  3) L thigh numbness 4) Nocturia   OBJECTIVE IMPAIRMENTS decreased activity tolerance, decreased coordination, decreased endurance, decreased mobility, difficulty walking, decreased ROM, decreased strength, decreased safety awareness, hypomobility, increased muscle spasms, impaired flexibility, improper body mechanics, postural dysfunction, and pain. scar restrictions   ACTIVITY LIMITATIONS  self-care,  sleep, home chores, work tasks    PARTICIPATION LIMITATIONS:  community, cooking  activities,     PERSONAL FACTORS    affecting patient's functional outcome:    REHAB POTENTIAL: Good   CLINICAL DECISION MAKING: Evolving/moderate complexity   EVALUATION COMPLEXITY: Moderate    PATIENT EDUCATION:    Education details: Showed pt anatomy images. Explained muscles attachments/ connection, physiology of deep core system/ spinal- thoracic-pelvis-lower kinetic chain as they relate to pt's presentation, Sx, and past Hx. Explained what and how these areas of deficits need to be restored to balance and function    See Therapeutic activity / neuromuscular re-education section  Answered pt's questions.   Person educated: Patient Education method: Explanation, Demonstration, Tactile cues, Verbal cues, and Handouts Education comprehension: verbalized understanding, returned demonstration, verbal cues required, tactile cues required, and needs further education     PLAN: PT FREQUENCY: 1x/week   PT DURATION: 10 weeks   PLANNED INTERVENTIONS:   Gait training;Stair training;Functional mobility training;DME Instruction;Therapeutic activities;Therapeutic exercise;Balance training;Neuromuscular re-education;Patient/family education;Vestibular;Visual/perceptual remediation/compensation;Passive range of motion;Moist Heat;Cryotherapy;Traction;Canalith Repostioning;Joint Manipulations;Manual lymph drainage;Manual techniques;Scar mobilization;Energy conservation;Dry needling;ADLs/Self Care Home Management;Biofeedback;Electrical Stimulation;Taping    PLAN FOR NEXT SESSION: See clinical impression for plan     GOALS: Goals reviewed with patient? Yes  SHORT TERM GOALS: Target date: 12/07/2023    Pt will demo IND with HEP                    Baseline: Not IND            Goal status: INITIAL   LONG TERM GOALS: Target date: 01/18/2024    1.Pt will demo proper deep core coordination without chest breathing and optimal excursion of diaphragm/pelvic floor in order to promote spinal stability and pelvic floor function   Baseline: dyscoordination Goal status:  MET   2.  Pt will demo proper body mechanics in against gravity tasks and ADLs  work tasks, fitness  to minimize straining pelvic floor / back    Baseline: not IND, improper form that places strain on pelvic floor  Goal status: MET     3. Pt will demo increased gait speed > 1.3 m/s with reciprocal gait pattern, longer stride length  in order to ambulate safely in community and return to fitness routine  Baseline: 1.24 m/s, pelvic shift to L, limited L  thoracic posterior rotation  Goal status:  MET   ( 12/21/23:  1.45 m/s , reciprocal )     4. Pt will demo levelled pelvic girdle and shoulder height in order to progress to deep core strengthening HEP and restore mobility at spine, pelvis, gait, posture minimize falls, and improve balance  Baseline: L shoulder, R iliac crest higher, thoracic shift to R  Goal status: MET    5. Pt will improve PFDI-7 questionnaire to  pts  score change  to demo improved QOL  Baseline:    ( greater pts indicate greater negative impact on QOL)   62  pts  ( total)  _ > 29   38   pts  ( UIQ-7 )   --> 24  19  pts  ( CRAIQ-7 )  --> 5   5   pts  ( POPIQ-7 )  -->  0   Baseline:  Goal status: MET    6.  Pt will report able to urinate without needing to go a second time and or able to initiate urination without prolapse in the way across > 75% of the time  Baseline:  Urination:  I feel like I got to go but I cannnot because bladder is in the way or she is trying to go a second Urination difficulty occurs  50% of the time Goal status:  MET      7. Pt will report no more straining with bowel movements  and report  Stool type 4   from _0% of the time to > 50% of the time, decrease type 1-2 from 100% of time to < 50% of the time  Baseline: straining 100% of the time  Goal status:  Partially MET ( 12/21/23:  straining 40% of time instead 100%  of the time)    8. Pt will communicate with MD about sleep study  Baseline:  pt has not tested , nocturia 3-4 x night  Goal Status: MET,  ( 12/21/23: pt read the articles therapist provided, pt voiced she will f/u )   9. Pt will report decreased numbness along L thigh from 8/10 to < 4/10 intensity while walking, cooking, Baseline: numbness along L thigh 8/10  walking, cooking, Goal Status: Partially MET  ( 12/21/23: not as often but at intensity 5/10 instead of 8/10 occurring after standing. Cooking 45 min )    Pia Lupe Plump, PT 12/28/2023, 1:23 PM

## 2024-01-04 ENCOUNTER — Ambulatory Visit: Admitting: Physical Therapy

## 2024-01-05 ENCOUNTER — Encounter: Payer: Self-pay | Admitting: Plastic Surgery

## 2024-01-05 ENCOUNTER — Ambulatory Visit: Admitting: Plastic Surgery

## 2024-01-05 VITALS — BP 155/73 | HR 66 | Ht 64.0 in | Wt 157.8 lb

## 2024-01-05 DIAGNOSIS — T8543XD Leakage of breast prosthesis and implant, subsequent encounter: Secondary | ICD-10-CM

## 2024-01-05 NOTE — Progress Notes (Signed)
   Subjective:    Patient ID: Donna Jefferson, female    DOB: August 13, 1940, 83 y.o.   MRN: [id number]  The patient is an 83 year old female here for evaluation and follow-up on her implants.  She had had implants placed by Dr. Emery over 40 years ago.  She had ptosis but opted to not have a breast lift.  She is in the wait and watch mode so no changes and does not seem to be having any issues.      Review of Systems  Constitutional: Negative.   HENT: Negative.    Eyes: Negative.   Respiratory: Negative.    Cardiovascular: Negative.   Gastrointestinal: Negative.   Endocrine: Negative.   Genitourinary: Negative.        Objective:   Physical Exam Vitals reviewed.  Constitutional:      Appearance: Normal appearance.  HENT:     Head: Atraumatic.  Cardiovascular:     Rate and Rhythm: Normal rate.     Pulses: Normal pulses.  Pulmonary:     Effort: Pulmonary effort is normal.  Abdominal:     Palpations: Abdomen is soft.  Skin:    General: Skin is warm.     Capillary Refill: Capillary refill takes less than 2 seconds.  Neurological:     Mental Status: She is alert and oriented to person, place, and time.  Psychiatric:        Mood and Affect: Mood normal.        Behavior: Behavior normal.        Thought Content: Thought content normal.        Judgment: Judgment normal.        Assessment & Plan:     ICD-10-CM   1. Rupture of implant of left breast, subsequent encounter  T85.43XD        Plan to continue to monitor.  She also asked about treatment for her face and I recommend cysteamine.  Pictures were obtained of the patient and placed in the chart with the patient's or guardian's permission.

## 2024-01-08 ENCOUNTER — Ambulatory Visit: Payer: Medicare PPO | Admitting: Plastic Surgery

## 2024-01-11 ENCOUNTER — Ambulatory Visit: Admitting: Physical Therapy

## 2024-01-11 DIAGNOSIS — M533 Sacrococcygeal disorders, not elsewhere classified: Secondary | ICD-10-CM | POA: Diagnosis not present

## 2024-01-11 DIAGNOSIS — M5459 Other low back pain: Secondary | ICD-10-CM

## 2024-01-11 DIAGNOSIS — R2689 Other abnormalities of gait and mobility: Secondary | ICD-10-CM

## 2024-01-11 NOTE — Patient Instructions (Signed)
 Stretches :   Neck / shoulder stretches:  _wall stretch Clock stretch with head turns :  stand perpendicular to the wall, L side to the wall Tilt head to wall  Place L palm at 7 o clock Chin tuck like you are looking into armpit Look at [place]  on giant map  Swivel head with chin tucked to look in upper corner of ceiling as if you are look at  [place] on giant map    15 reps   Switch direction, R palm on wall at 5 o 'clock   Chin tuck like you are looking into armpit Look at [facility]  on giant map  Swivel head with chin tucked to look in upper corner of ceiling as if you are look at  [facility] on giant map    15 reps   __  Place band in U    band under ballmounds  while laying on back w/ knees bent   Lying on back, knees bent    band under ballmounds  while laying on back w/ knees bent  W exercise  10 reps x 2 sets   Band is placed under feet, knees bent, feet are hip width apart Hold band with thumbs point out, keep upper arm and elbow touching the bed the whole time  - inhale and then exhale pull bands by bending elbows hands move in a w  (feel shoulder blades squeezing)   ________________

## 2024-01-11 NOTE — Therapy (Addendum)
 " OUTPATIENT PHYSICAL THERAPY treatment  Patient Name: Donna Jefferson MRN: [id number] DOB:1940-07-01, 83 y.o., female Today's Date: 01/11/2024   PT End of Session - 01/11/24 1106     Visit Number 8    Number of Visits 10    Date for Recertification  01/18/24    PT Start Time 1103    PT Stop Time 1145    PT Time Calculation (min) 42 min    Activity Tolerance Patient tolerated treatment well;No increased pain    Behavior During Therapy WFL for tasks assessed/performed          Past Medical History:  Diagnosis Date   Actinic keratosis    Arthritis    osteoarthritis   GERD (gastroesophageal reflux disease)    Headache    migraines   Hx of squamous cell carcinoma    multiple sites   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 SCCis   Squamous cell skin cancer 11/02/2013   Left medial infraclavicular. SCCis, early evolving.   Squamous cell skin cancer 07/29/2018   Left distal pretibial. WD SCC with superficial infiltration.   Past Surgical History:  Procedure Laterality Date   ABDOMINAL HYSTERECTOMY     APPENDECTOMY     AUGMENTATION MAMMAPLASTY Bilateral 1980's   CATARACT EXTRACTION, BILATERAL     CHOLECYSTECTOMY     COLONOSCOPY     EYE SURGERY Bilateral    Cataract Extraction with IOL   JOINT REPLACEMENT Right    knee   KNEE ARTHROPLASTY Left 04/09/2016   Procedure: COMPUTER ASSISTED TOTAL KNEE ARTHROPLASTY;  Surgeon: Lynwood SHAUNNA Hue, MD;  Location: [facility];  Service: Orthopedics;  Laterality: Left;   KNEE ARTHROSCOPY Left 04/02/2015   Procedure: ARTHROSCOPY KNEE. 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  REFERRING PROVIDER: Schermerhorn   REFERRING DIAG: cystocele-midlinee  Rationale for Evaluation and Treatment Rehabilitation  THERAPY DIAG:  Sacrococcygeal disorders, not elsewhere classified  Other abnormalities of gait and mobility  Other low back pain  ONSET DATE:   SUBJECTIVE:           SUBJECTIVE STATEMENT TODAY:  Pt  reported she is  finished taking antibiotic for UTI.   Pt noticed the L thigh numbness still occurs occasionally.   LBP is better this past week   SUBJECTIVE STATEMENT ON EVAL 11/09/23 : 1) vaginal bulge: pt noticed it until she is in the shower when she is washing herself. Pt is pushing it up. Af first when it first happened. It bothered her. Pt has adjusted to it mentally and has not noticed bothering her. Pt goes up and down stairs. Pt does not notice the prolapse worsening when lifting small items. Urination:  I feel like I got to go but I cannnot because bladder is in the way or she is trying to go a second Urination difficulty occurs  50% of the time . Pt strains 100% of the time with bowel movements. Stool consistency Type 1-2 . Takes probiotics/ Metamucil, eating fiber, drinks lots of water   2) low  back pain located between low ribs- occurred for the last 6 months, after shopping 2-3 hours, doing dishes, 6-7/10 pain , uses cold pack for full relief   3) L thigh numbness started this past summer.  It occurs after she has been up for a while, walking, cooking, . It appears suddenly. It eases with sitting. Numbness intensity 8/10 covers the top of her thigh above the knee to the side hip   4) Nocturia:  3-4 x night, has not had sleep study to rule out/ in OSA,    PERTINENT HISTORY:  2 vaginal deliveries , does not remember perineal scars   Abdominal hysterectomy Appendectomy Gall bladder removal   B TKA   Skin cancer   PAIN:  Are you having pain? Yes: see  above   PRECAUTIONS: No  WEIGHT BEARING RESTRICTIONS: No  FALLS:  Has patient fallen in last 6 months? No   LIVING ENVIRONMENT: Lives with: husband  Lives in: one story  Stairs: 3 STE with rail   OCCUPATION: retired runner, broadcasting/film/video , hobbies: reading, bible study, aquatic classes M, W, F, beach trips once a month,   PLOF: IND  PATIENT GOALS:  Low back pain resolves,  prolapse issue improve, decrease urination at night , decrease L thigh numbness improved     OBJECTIVE:    [facility] PT Assessment - 01/11/24 1119       Palpation   Palpation comment L deviated T1-3, hypombile and tenderness, tightness along L medial scapula, intercostal T7-10 tightness, limited posterior /lateral/anterior excursion L          [facility] Adult PT Treatment/Exercise - 01/11/24 1146       Self-Care   Self-Care Other Self-Care Comments    Other Self-Care Comments  explained the path the thoracic nn affecting thigh region ( iliohypogastric and ilioinguinal nn) and related to deviated segments of L thoracic      Neuro Re-ed    Neuro Re-ed Details  provided propioceptive cues for new resistance band strenghtening for motor planning and cervico/scapular stabilization to realign spine and optimize diaphragm and minimize L thigh numbness and sequential and propioceptive cues for ralgnng C/T segment      Manual Therapy   Manual therapy comments STM/MWM at problem areas noted in asesssment to improve alignment of spine and minimze L thigh numbness             HOME EXERCISE PROGRAM: See pt instruction section    ASSESSMENT:  CLINICAL IMPRESSION:  Across the past  visits,  Improvements include:  Pt is no longer having to push up her bladder and it feels it is not as low as it was.  Straining with urination decreased from 50% of the time to 25% of the time and with bowel movements from 100% to 50% of the  time  Pt does not experience a dribble after urinating the first time to urinating  L thigh  numbness decreased from 8/10 pain intensity to 4/10 pain intensity LBP decreased from 6-7/10 to 3-4 /10 while  shopping 2-3 hours, doing dishes, 6-7/10 pain , uses cold pack for full relief  Pt has achieved more upright posture, levelled shoulders and pelvis. Pt is more aware of her body mechanics and posture     Skilled PT will further help with remaining goals to help improve complete emptying of urine and bowels without straining and improve L radiating thigh pain from low back.     Today focused on realignment  of C/T junction and minimizing  deviated throacic segments to L t address L thigh numbness. Manual Tx was modfiied for comfort. Explained the path the thoracic nn affecting thigh region (iliohypogastric and ilioinguinal nn) and related to deviated segments of L thoracic . Provided propioceptive cues for new resistance band strenghtening for motor planning and cervico/scapular stabilization to realign spine and optimize diaphragm and minimize L thigh numbness and sequential and propioceptive cues for raligning C/T segment    Plan to assess  diaphragm excursion to optimize deep core function and progress wihh external pelvic floor assessment and  advance  cervicoscapular/ thoracolumbar strengthening next session  .  Anticipate these improvements will  to help promote optimize IAP system for improved pelvic floor function, trunk stability, gait, balance, stabilization with mobility tasks. Regional interdependent approaches will yield greater benefits in pt's POC due to the complexity of pt's medical Hx    Pt benefits from skilled PT to address these Sx:  1) vaginal bulge: 2) low back pain  3) L thigh numbness 4) Nocturia   OBJECTIVE IMPAIRMENTS decreased activity tolerance, decreased coordination, decreased endurance, decreased mobility, difficulty walking, decreased ROM, decreased strength, decreased safety awareness, hypomobility, increased muscle spasms, impaired flexibility,  improper body mechanics, postural dysfunction, and pain. scar restrictions   ACTIVITY LIMITATIONS  self-care,  sleep, home chores, work tasks    PARTICIPATION LIMITATIONS:  community, cooking  activities,     PERSONAL FACTORS   affecting patient's functional outcome:    REHAB POTENTIAL: Good   CLINICAL DECISION MAKING: Evolving/moderate complexity   EVALUATION COMPLEXITY: Moderate    PATIENT EDUCATION:    Education details: Showed pt anatomy images. Explained muscles attachments/ connection, physiology of deep core system/ spinal- thoracic-pelvis-lower kinetic chain as they relate to pt's presentation, Sx, and past Hx. Explained what and how these areas of deficits need to be restored to balance and function    See Therapeutic activity / neuromuscular re-education section  Answered pt's questions.   Person educated: Patient Education method: Explanation, Demonstration, Tactile cues, Verbal cues, and Handouts Education comprehension: verbalized understanding, returned demonstration, verbal cues required, tactile cues required, and needs further education     PLAN: PT FREQUENCY: 1x/week   PT DURATION: 10 weeks   PLANNED INTERVENTIONS:   Gait training;Stair training;Functional mobility training;DME Instruction;Therapeutic activities;Therapeutic exercise;Balance training;Neuromuscular re-education;Patient/family education;Vestibular;Visual/perceptual remediation/compensation;Passive range of motion;Moist Heat;Cryotherapy;Traction;Canalith Repostioning;Joint Manipulations;Manual lymph drainage;Manual techniques;Scar mobilization;Energy conservation;Dry needling;ADLs/Self Care Home Management;Biofeedback;Electrical Stimulation;Taping    PLAN FOR NEXT SESSION: See clinical impression for plan     GOALS: Goals reviewed with patient? Yes  SHORT TERM GOALS: Target date: 12/07/2023    Pt will demo IND with HEP                    Baseline: Not IND            Goal status:  INITIAL   LONG TERM GOALS: Target date: 01/18/2024    1.Pt will demo proper deep core coordination without chest breathing and optimal excursion of diaphragm/pelvic floor in order to promote spinal stability and pelvic floor function  Baseline: dyscoordination Goal status:  MET   2.  Pt will demo proper body mechanics in against gravity tasks and ADLs  work tasks, fitness  to minimize straining pelvic floor / back    Baseline: not IND, improper form that places strain on pelvic floor  Goal status: MET     3. Pt will demo increased gait speed > 1.3 m/s with reciprocal gait pattern, longer stride length  in order to  ambulate safely in community and return to fitness routine  Baseline: 1.24 m/s, pelvic shift to L, limited L thoracic posterior rotation  Goal status:  MET   ( 12/21/23:  1.45 m/s , reciprocal )     4. Pt will demo levelled pelvic girdle and shoulder height in order to progress to deep core strengthening HEP and restore mobility at spine, pelvis, gait, posture minimize falls, and improve balance  Baseline: L shoulder, R iliac crest higher, thoracic shift to R  Goal status: MET    5. Pt will improve PFDI-7 questionnaire to  pts  score change  to demo improved QOL  Baseline:    ( greater pts indicate greater negative impact on QOL)   62  pts  ( total)  _ > 29   38   pts  ( UIQ-7 )   --> 24  19  pts  ( CRAIQ-7 )  --> 5   5   pts  ( POPIQ-7 )  -->  0   Baseline:  Goal status: MET    6.  Pt will report able to urinate without needing to go a second time and or able to initiate urination without prolapse in the way across > 75% of the time  Baseline:  Urination:  I feel like I got to go but I cannnot because bladder is in the way or she is trying to go a second Urination difficulty occurs  50% of the time Goal status:  MET      7. Pt will report no more straining with bowel movements  and report  Stool type 4   from _0% of the time to > 50% of the time, decrease type  1-2 from 100% of time to < 50% of the time  Baseline: straining 100% of the time  Goal status:  Partially MET ( 12/21/23:  straining 40% of time instead 100%  of the time)    8. Pt will communicate with MD about sleep study  Baseline: pt has not tested , nocturia 3-4 x night  Goal Status: MET,  ( 12/21/23: pt read the articles therapist provided, pt voiced she will f/u )   9. Pt will report decreased numbness along L thigh from 8/10 to < 4/10 intensity while walking, cooking, Baseline: numbness along L thigh 8/10  walking, cooking, Goal Status: Partially MET  ( 12/21/23: not as often but at intensity 5/10 instead of 8/10 occurring after standing. Cooking 45 min )    Pia Lupe Plump, PT 01/11/2024, 11:07 AM  "

## 2024-01-25 ENCOUNTER — Ambulatory Visit: Admitting: Physical Therapy

## 2024-01-26 ENCOUNTER — Ambulatory Visit: Admitting: Physical Therapy

## 2024-01-26 DIAGNOSIS — M533 Sacrococcygeal disorders, not elsewhere classified: Secondary | ICD-10-CM | POA: Diagnosis not present

## 2024-01-26 DIAGNOSIS — M5459 Other low back pain: Secondary | ICD-10-CM

## 2024-01-26 DIAGNOSIS — R2689 Other abnormalities of gait and mobility: Secondary | ICD-10-CM

## 2024-01-26 NOTE — Therapy (Addendum)
 " OUTPATIENT PHYSICAL THERAPY treatment  / Recert  Patient Name: Donna Jefferson MRN: [id number] DOB:05/26/1940, 83 y.o., female Today's Date: 01/26/2024   PT End of Session - 01/26/24 1340     Visit Number 9    Number of Visits 19   Date for Recertification  04/05/24    PT Start Time 1333    PT Stop Time 1415    PT Time Calculation (min) 42 min    Activity Tolerance Patient tolerated treatment well;No increased pain    Behavior During Therapy WFL for tasks assessed/performed          Past Medical History:  Diagnosis Date   Actinic keratosis    Arthritis    osteoarthritis   GERD (gastroesophageal reflux disease)    Headache    migraines   Hx of squamous cell carcinoma    multiple sites   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 SCCis   Squamous cell skin cancer 11/02/2013   Left medial infraclavicular. SCCis, early evolving.   Squamous cell skin cancer 07/29/2018   Left distal pretibial. WD SCC with superficial infiltration.   Past Surgical History:  Procedure Laterality Date   ABDOMINAL HYSTERECTOMY     APPENDECTOMY     AUGMENTATION MAMMAPLASTY Bilateral 1980's   CATARACT EXTRACTION, BILATERAL     CHOLECYSTECTOMY     COLONOSCOPY     EYE SURGERY Bilateral    Cataract Extraction with IOL   JOINT REPLACEMENT Right    knee   KNEE ARTHROPLASTY Left 04/09/2016   Procedure: COMPUTER ASSISTED TOTAL KNEE ARTHROPLASTY;  Surgeon: Lynwood SHAUNNA Hue, MD;  Location: [facility];  Service: Orthopedics;  Laterality: Left;   KNEE ARTHROSCOPY Left 04/02/2015   Procedure: ARTHROSCOPY KNEE. 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  REFERRING PROVIDER: Schermerhorn   REFERRING DIAG: cystocele-midlinee  Rationale for Evaluation and Treatment Rehabilitation  THERAPY DIAG:  Sacrococcygeal disorders, not elsewhere classified  Other abnormalities of gait and mobility  Other low back pain  ONSET DATE:   SUBJECTIVE:           SUBJECTIVE STATEMENT TODAY:   Pt noticed the L thigh numbness has not bothered her as much even after taking down xmas decoration and doing dishes.      SUBJECTIVE STATEMENT ON EVAL 11/09/23 : 1) vaginal bulge: pt noticed it until she is in the shower when she is washing herself. Pt is pushing it up. Af first when it first happened. It bothered her. Pt has adjusted to it mentally and has not noticed bothering her. Pt goes up and down stairs. Pt does not notice the prolapse worsening when lifting small items. Urination:  I feel like I got to go but I cannnot because bladder is in the way or she is trying to go a second Urination difficulty occurs  50% of the time . Pt strains 100% of the time with bowel movements. Stool consistency Type 1-2 . Takes probiotics/ Metamucil, eating fiber, drinks lots of water   2) low back pain located  between low ribs- occurred for the last 6 months, after shopping 2-3 hours, doing dishes, 6-7/10 pain , uses cold pack for full relief   3) L thigh numbness started this past summer.  It occurs after she has been up for a while, walking, cooking, . It appears suddenly. It eases with sitting. Numbness intensity 8/10 covers the top of her thigh above the knee to the side hip   4) Nocturia:  3-4 x night, has not had sleep study to rule out/ in OSA,    PERTINENT HISTORY:  2 vaginal deliveries , does not remember perineal scars   Abdominal hysterectomy Appendectomy Gall bladder removal   B TKA   Skin cancer   PAIN:  Are you having pain? Yes: see above   PRECAUTIONS:  No  WEIGHT BEARING RESTRICTIONS: No  FALLS:  Has patient fallen in last 6 months? No   LIVING ENVIRONMENT: Lives with: husband  Lives in: one story  Stairs: 3 STE with rail   OCCUPATION: retired runner, broadcasting/film/video , hobbies: reading, bible study, aquatic classes M, W, F, beach trips once a month,   PLOF: IND  PATIENT GOALS:  Low back pain resolves,  prolapse issue improve, decrease urination at night , decrease L thigh numbness improved     OBJECTIVE:     [facility] PT Assessment - 01/26/24 1415       Palpation   Palpation comment R T1-3 deviated, interspinal mm tightness, scalene, levator scapula, R,   hypomobile T1-3           [facility] Adult PT Treatment/Exercise - 01/11/24 1146       Self-Care   Self-Care Other Self-Care Comments    Other Self-Care Comments  explained the path the thoracic nn affecting thigh region ( iliohypogastric and ilioinguinal nn) and related to deviated segments of L thoracic      Neuro Re-ed    Neuro Re-ed Details  provided propioceptive cues for new resistance band strenghtening for motor planning and cervico/scapular stabilization to realign spine and optimize diaphragm and minimize L thigh numbness and sequential and propioceptive cues for ralgnng C/T segment      Manual Therapy   Manual therapy comments STM/MWM at problem areas noted in asesssment to improve alignment of spine and minimze L thigh numbness             HOME EXERCISE PROGRAM: See pt instruction section    ASSESSMENT:  CLINICAL IMPRESSION:  Pt has met 7/9 goals and partially met remaining goals.   Across the past  visits,  Improvements include:  Vaginal bulging improvements:  Pt is no longer having to push up her bladder and it feels it is not as low as it was.  Straining with urination decreased from 50% of the time to 25% of the time and with bowel movements from 100% to 50% of the  time  Pt does not experience a dribble after urinating the first time to urinating    Nocturia improvements: Nocturia: decreased to 2 x night instead of  3-4 x night   L thigh numbness improvements:  L thigh numbness decreased from 8/10 pain intensity to 4/10 pain intensity LBP decreased from 6-7/10 to 3-4 /10 while  shopping 2-3 hours, doing dishes from 6-7/10 to  5/10 pain , uses cold pack for full relief   Structural improvements: Pt has achieved more upright posture, levelled shoulders and pelvis. Pt is more aware of her body mechanics and posture    Today, continued to address  tightness tightness and hypomobility at C/T and T spine to optimize diaphragm and deep core.   Added new C/T stretch at wall.  Plan to add cervicoscapular strengthening at next session  Discussed upcoming sessions to address prolapse   Skilled PT will further help with remaining goals to help improve complete emptying of urine and bowels without straining and improve L radiating thigh pain from low back.    .  Anticipate these improvements will  to help promote optimize IAP system for improved pelvic floor function, trunk stability, gait, balance, stabilization with mobility tasks. Regional interdependent approaches will yield greater benefits in pt's POC due to the complexity of pt's medical Hx    Pt benefits from skilled PT to address these Sx:  vaginal bulge:    OBJECTIVE IMPAIRMENTS decreased activity tolerance, decreased coordination, decreased endurance, decreased mobility, difficulty walking, decreased ROM, decreased strength, decreased safety awareness, hypomobility, increased muscle spasms, impaired flexibility, improper body mechanics, postural dysfunction, and pain. scar restrictions   ACTIVITY LIMITATIONS  self-care,  sleep, home chores, work tasks    PARTICIPATION LIMITATIONS:  community, cooking  activities,     PERSONAL FACTORS   affecting patient's functional outcome:    REHAB POTENTIAL: Good   CLINICAL DECISION MAKING: Evolving/moderate complexity   EVALUATION  COMPLEXITY: Moderate    PATIENT EDUCATION:    Education details: Showed pt anatomy images. Explained muscles attachments/ connection, physiology of deep core system/ spinal- thoracic-pelvis-lower kinetic chain as they relate to pt's presentation, Sx, and past Hx. Explained what and how these areas of deficits need to be restored to balance and function    See Therapeutic activity / neuromuscular re-education section  Answered pt's questions.   Person educated: Patient Education method: Explanation, Demonstration, Tactile cues, Verbal cues, and Handouts Education comprehension: verbalized understanding, returned demonstration, verbal cues required, tactile cues required, and needs further education     PLAN: PT FREQUENCY: 1x/week   PT DURATION: 10 weeks   PLANNED INTERVENTIONS:   Gait training;Stair training;Functional mobility training;DME Instruction;Therapeutic activities;Therapeutic exercise;Balance training;Neuromuscular re-education;Patient/family education;Vestibular;Visual/perceptual remediation/compensation;Passive range of motion;Moist Heat;Cryotherapy;Traction;Canalith Repostioning;Joint Manipulations;Manual lymph drainage;Manual techniques;Scar mobilization;Energy conservation;Dry needling;ADLs/Self Care Home Management;Biofeedback;Electrical Stimulation;Taping    PLAN FOR NEXT SESSION: See clinical impression for plan     GOALS: Goals reviewed with patient? Yes  SHORT TERM GOALS: Target date: 12/07/2023    Pt will demo IND with HEP                    Baseline: Not IND            Goal status: INITIAL   LONG TERM GOALS: Target date: 04/05/2024      1.Pt will demo proper deep core coordination without chest breathing and optimal excursion of diaphragm/pelvic floor in order to promote spinal stability and pelvic floor function  Baseline: dyscoordination Goal status:  MET   2.  Pt will demo proper body mechanics in against gravity tasks and ADLs  work tasks,  fitness  to minimize straining pelvic floor / back    Baseline: not IND, improper form that places strain on pelvic floor  Goal status: MET     3. Pt will demo increased gait speed > 1.3 m/s with reciprocal gait pattern, longer stride length  in order to ambulate safely in community and return to fitness routine  Baseline: 1.24 m/s, pelvic shift to L, limited L thoracic posterior rotation  Goal status:  MET   ( 12/21/23:  1.45 m/s , reciprocal )  4. Pt will demo levelled pelvic girdle and shoulder height in order to progress to deep core strengthening HEP and restore mobility at spine, pelvis, gait, posture minimize falls, and improve balance  Baseline: L shoulder, R iliac crest higher, thoracic shift to R  Goal status: MET    5. Pt will improve PFDI-7 questionnaire to  pts  score change  to demo improved QOL  Baseline:    ( greater pts indicate greater negative impact on QOL)   62  pts  ( total)  _ > 29   38   pts  ( UIQ-7 )   --> 24  19  pts  ( CRAIQ-7 )  --> 5   5   pts  ( POPIQ-7 )  -->  0   Baseline:  Goal status: MET    6.  Pt will report able to urinate without needing to go a second time and or able to initiate urination without prolapse in the way across > 75% of the time  Baseline:  Urination:  I feel like I got to go but I cannnot because bladder is in the way or she is trying to go a second Urination difficulty occurs  50% of the time Goal status:  MET      7. Pt will report no more straining with bowel movements  and report  Stool type 4   from _0% of the time to > 50% of the time, decrease type 1-2 from 100% of time to < 50% of the time  Baseline: straining 100% of the time  Goal status:  Partially MET ( 12/21/23:  straining 40% of time instead 100%  of the time)    8. Pt will communicate with MD about sleep study  Baseline: pt has not tested , nocturia 3-4 x night  Goal Status: MET,  ( 12/21/23: pt read the articles therapist provided, pt voiced she will  f/u )   9. Pt will report decreased numbness along L thigh from 8/10 to < 4/10 intensity while walking, cooking, Baseline: numbness along L thigh 8/10  walking, cooking, Goal Status: Partially MET  ( 12/21/23: not as often but at intensity 5/10 instead of 8/10 occurring after standing. Cooking 45 min )    Pia Lupe Plump, PT 01/26/2024, 1:41 PM  "

## 2024-01-26 NOTE — Patient Instructions (Signed)
  _wall stretch Clock stretch with head turns :  stand perpendicular to the wall, L side to the wall Tilt head to wall  Place L palm at 7 o clock Chin tuck like you are looking into armpit Look at "[place] on giant map  Swivel head with chin tucked to look in upper corner of ceiling as if you are look at  [place] on giant map "   15 reps   Switch direction, R palm on wall at 5 o 'clock   Chin tuck like you are looking into armpit Look at "[place]  on giant map  Swivel head with chin tucked to look in upper corner of ceiling as if you are look at  [facility] on giant map "   15 reps

## 2024-02-01 ENCOUNTER — Ambulatory Visit: Attending: Obstetrics and Gynecology | Admitting: Physical Therapy

## 2024-02-01 DIAGNOSIS — M5459 Other low back pain: Secondary | ICD-10-CM | POA: Insufficient documentation

## 2024-02-01 DIAGNOSIS — R2689 Other abnormalities of gait and mobility: Secondary | ICD-10-CM | POA: Diagnosis present

## 2024-02-01 DIAGNOSIS — M533 Sacrococcygeal disorders, not elsewhere classified: Secondary | ICD-10-CM | POA: Insufficient documentation

## 2024-02-01 NOTE — Addendum Note (Signed)
 Addended by: PIA FISCAL on: 02/01/2024 11:18 AM   Modules accepted: Orders

## 2024-02-01 NOTE — Therapy (Signed)
 " OUTPATIENT PHYSICAL THERAPY treatment  / Prgress Note across 10 visist from 11/09/23 to 02/01/24  Patient Name: Donna Jefferson MRN: [id number] DOB:1940/09/22, 84 y.o., female Today's Date: 02/01/2024    PT End of Session - 02/01/24 1118     Visit Number 10    Number of Visits 19    Date for Recertification  04/05/24    PT Start Time 1113    PT Stop Time 1151    PT Time Calculation (min) 38 min    Activity Tolerance Patient tolerated treatment well;No increased pain    Behavior During Therapy WFL for tasks assessed/performed             Past Medical History:  Diagnosis Date   Actinic keratosis    Arthritis    osteoarthritis   GERD (gastroesophageal reflux disease)    Headache    migraines   Hx of squamous cell carcinoma    multiple sites   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 SCCis   Squamous cell skin cancer 11/02/2013   Left medial infraclavicular. SCCis, early evolving.   Squamous cell skin cancer 07/29/2018   Left distal pretibial. WD SCC with superficial infiltration.   Past Surgical History:  Procedure Laterality Date   ABDOMINAL HYSTERECTOMY     APPENDECTOMY     AUGMENTATION MAMMAPLASTY Bilateral 1980's   CATARACT EXTRACTION, BILATERAL     CHOLECYSTECTOMY     COLONOSCOPY     EYE SURGERY Bilateral    Cataract Extraction with IOL   JOINT REPLACEMENT Right    knee   KNEE ARTHROPLASTY Left 04/09/2016   Procedure: COMPUTER ASSISTED TOTAL KNEE ARTHROPLASTY;  Surgeon: Lynwood SHAUNNA Hue, MD;  Location: [facility];  Service: Orthopedics;  Laterality: Left;   KNEE ARTHROSCOPY Left 04/02/2015   Procedure: ARTHROSCOPY KNEE. 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  REFERRING PROVIDER: Schermerhorn   REFERRING DIAG: cystocele-midlinee  Rationale for Evaluation and Treatment Rehabilitation  THERAPY DIAG:  Sacrococcygeal disorders, not elsewhere classified  Other low back pain  Other abnormalities of gait and mobility  ONSET DATE:   SUBJECTIVE:           SUBJECTIVE STATEMENT TODAY:   Pt noticed the L thigh numbness is much better, she only experienced it one day last weekend instead of experiencing it everyday.      SUBJECTIVE STATEMENT ON EVAL 11/09/23 : 1) vaginal bulge: pt noticed it until she is in the shower when she is washing herself. Pt is pushing it up. Af first when it first happened. It bothered her. Pt has adjusted to it mentally and has not noticed bothering her. Pt goes up and down stairs. Pt does not notice the prolapse worsening when lifting small items. Urination:  I feel like I got to go but I cannnot because bladder is in the way or she is trying to go a second Urination difficulty occurs  50% of the time . Pt strains 100% of the time with bowel movements. Stool consistency Type 1-2 . Takes probiotics/  Metamucil, eating fiber, drinks lots of water   2) low back pain located between low ribs- occurred for the last 6 months, after shopping 2-3 hours, doing dishes, 6-7/10 pain , uses cold pack for full relief   3) L thigh numbness started this past summer.  It occurs after she has been up for a while, walking, cooking, . It appears suddenly. It eases with sitting. Numbness intensity 8/10 covers the top of her thigh above the knee to the side hip   4) Nocturia:  3-4 x night, has not had sleep study to rule out/ in OSA,    PERTINENT HISTORY:  2 vaginal deliveries , does not remember perineal scars   Abdominal hysterectomy Appendectomy Gall bladder removal   B TKA   Skin  cancer   PAIN:  Are you having pain? Yes: see above   PRECAUTIONS: No  WEIGHT BEARING RESTRICTIONS: No  FALLS:  Has patient fallen in last 6 months? No   LIVING ENVIRONMENT: Lives with: husband  Lives in: one story  Stairs: 3 STE with rail   OCCUPATION: retired runner, broadcasting/film/video , hobbies: reading, bible study, aquatic classes M, W, F, beach trips once a month,   PLOF: IND  PATIENT GOALS:  Low back pain resolves,  prolapse issue improve, decrease urination at night , decrease L thigh numbness improved     OBJECTIVE:      [facility] PT Assessment - 02/01/24 1203       Palpation   Palpation comment T1-3 deviated R , tender, interspinals, L levator scapula, anterior neck mm tightness          [facility] Adult PT Treatment/Exercise - 02/01/24 1205       Neuro Re-ed    Neuro Re-ed Details  provided cervical retraction minimize upper trap depression, scapular retraction      Manual Therapy   Manual therapy comments medial Grade II-IIImob T1-3, STM/MWM at problem areas noted in assessment,  Modified technique with jostling to accommodate comfort                HOME EXERCISE PROGRAM: See pt instruction section    ASSESSMENT:  CLINICAL IMPRESSION:  Pt has met 7/9 goals and partially met remaining goals.   Across the past  visits,  Improvements include:  Vaginal bulging improvements:  Pt is no longer having to push up her bladder and it feels it is not as low as it was.  Straining with urination decreased from 50% of the time to 25% of the time and with bowel movements from 100% to 50% of the  time  Pt does not experience a dribble after urinating the first time to urinating   Nocturia improvements: Nocturia: decreased to 2 x night instead of  3-4 x night   L thigh numbness improvements:  L thigh numbness decreased from 8/10 pain intensity to 4/10 pain intensity LBP decreased from 6-7/10 to 3-4 /10 while  shopping 2-3 hours, doing dishes from 6-7/10 to  5/10 pain ,  uses cold pack for full relief   Structural improvements: Pt has achieved more upright posture, levelled shoulders and pelvis. Pt is more aware of her body mechanics and posture    Today, continued to address tightness tightness and hypomobility at C/T and T spine to optimize diaphragm and deep core and to minimize downward forces upon pelvic organs.     Plan to add cervicoscapular strengthening at next session  Discussed upcoming sessions to address prolapse   Skilled PT will further  help with remaining goals to help improve complete emptying of urine and bowels without straining and improve L radiating thigh pain from low back.    .  Anticipate these improvements will  to help promote optimize IAP system for improved pelvic floor function, trunk stability, gait, balance, stabilization with mobility tasks. Regional interdependent approaches will yield greater benefits in pt's POC due to the complexity of pt's medical Hx    Pt benefits from skilled PT to address these Sx:  vaginal bulge:    OBJECTIVE IMPAIRMENTS decreased activity tolerance, decreased coordination, decreased endurance, decreased mobility, difficulty walking, decreased ROM, decreased strength, decreased safety awareness, hypomobility, increased muscle spasms, impaired flexibility, improper body mechanics, postural dysfunction, and pain. scar restrictions   ACTIVITY LIMITATIONS  self-care,  sleep, home chores, work tasks    PARTICIPATION LIMITATIONS:  community, cooking  activities,     PERSONAL FACTORS   affecting patient's functional outcome:    REHAB POTENTIAL: Good   CLINICAL DECISION MAKING: Evolving/moderate complexity   EVALUATION COMPLEXITY: Moderate    PATIENT EDUCATION:    Education details: Showed pt anatomy images. Explained muscles attachments/ connection, physiology of deep core system/ spinal- thoracic-pelvis-lower kinetic chain as they relate to pt's presentation, Sx, and past Hx. Explained  what and how these areas of deficits need to be restored to balance and function    See Therapeutic activity / neuromuscular re-education section  Answered pt's questions.   Person educated: Patient Education method: Explanation, Demonstration, Tactile cues, Verbal cues, and Handouts Education comprehension: verbalized understanding, returned demonstration, verbal cues required, tactile cues required, and needs further education     PLAN: PT FREQUENCY: 1x/week   PT DURATION: 10 weeks   PLANNED INTERVENTIONS:   Gait training;Stair training;Functional mobility training;DME Instruction;Therapeutic activities;Therapeutic exercise;Balance training;Neuromuscular re-education;Patient/family education;Vestibular;Visual/perceptual remediation/compensation;Passive range of motion;Moist Heat;Cryotherapy;Traction;Canalith Repostioning;Joint Manipulations;Manual lymph drainage;Manual techniques;Scar mobilization;Energy conservation;Dry needling;ADLs/Self Care Home Management;Biofeedback;Electrical Stimulation;Taping    PLAN FOR NEXT SESSION: See clinical impression for plan     GOALS: Goals reviewed with patient? Yes  SHORT TERM GOALS: Target date: 12/07/2023    Pt will demo IND with HEP                    Baseline: Not IND            Goal status: INITIAL   LONG TERM GOALS: Target date: 04/05/2024      1.Pt will demo proper deep core coordination without chest breathing and optimal excursion of diaphragm/pelvic floor in order to promote spinal stability and pelvic floor function  Baseline: dyscoordination Goal status:  MET   2.  Pt will demo proper body mechanics in against gravity tasks and ADLs  work tasks, fitness  to minimize straining pelvic floor / back    Baseline: not IND, improper form that places strain on pelvic floor  Goal status: MET     3. Pt will demo increased gait speed > 1.3 m/s with reciprocal gait pattern, longer stride length  in order to ambulate safely in  community and return to fitness routine  Baseline: 1.24 m/s, pelvic shift to L, limited L thoracic posterior rotation  Goal status:  MET   ( 12/21/23:  1.45 m/s , reciprocal )     4. Pt will demo levelled pelvic girdle and shoulder height in order to progress to deep core strengthening HEP and restore mobility at spine, pelvis, gait, posture minimize falls, and improve balance  Baseline: L shoulder, R iliac crest higher, thoracic shift to R  Goal status: MET    5. Pt will improve PFDI-7 questionnaire to  pts  score change  to demo improved QOL  Baseline:    ( greater pts indicate greater negative impact on QOL)   62  pts  ( total)  _ > 29   38   pts  ( UIQ-7 )   --> 24  19  pts  ( CRAIQ-7 )  --> 5   5   pts  ( POPIQ-7 )  -->  0   Baseline:  Goal status: MET    6.  Pt will report able to urinate without needing to go a second time and or able to initiate urination without prolapse in the way across > 75% of the time  Baseline:  Urination:  I feel like I got to go but I cannnot because bladder is in the way or she is trying to go a second Urination difficulty occurs  50% of the time Goal status:  MET      7. Pt will report no more straining with bowel movements  and report  Stool type 4   from _0% of the time to > 50% of the time, decrease type 1-2 from 100% of time to < 50% of the time  Baseline: straining 100% of the time  Goal status:  Partially MET ( 12/21/23:  straining 40% of time instead 100%  of the time)    8. Pt will communicate with MD about sleep study  Baseline: pt has not tested , nocturia 3-4 x night  Goal Status: MET,  ( 12/21/23: pt read the articles therapist provided, pt voiced she will f/u )   9. Pt will report decreased numbness along L thigh from 8/10 to < 4/10 intensity while walking, cooking, Baseline: numbness along L thigh 8/10  walking, cooking, Goal Status: Partially MET  ( 12/21/23: not as often but at intensity 5/10 instead of 8/10 occurring after  standing. Cooking 45 min )    Pia Lupe Plump, PT 02/01/2024, 11:19 AM  "

## 2024-02-10 ENCOUNTER — Ambulatory Visit: Admitting: Physical Therapy

## 2024-02-10 DIAGNOSIS — M5459 Other low back pain: Secondary | ICD-10-CM

## 2024-02-10 DIAGNOSIS — M533 Sacrococcygeal disorders, not elsewhere classified: Secondary | ICD-10-CM

## 2024-02-10 DIAGNOSIS — R2689 Other abnormalities of gait and mobility: Secondary | ICD-10-CM

## 2024-02-10 NOTE — Therapy (Signed)
 " OUTPATIENT PHYSICAL THERAPY treatment   Patient Name: Donna Jefferson MRN: [id number] DOB:May 19, 1940, 84 y.o., female Today's Date: 02/10/2024    PT End of Session - 02/10/24 1423     Visit Number 11    Number of Visits 19    Date for Recertification  04/05/24    PT Start Time 1420    PT Stop Time 1500    PT Time Calculation (min) 40 min    Activity Tolerance Patient tolerated treatment well;No increased pain    Behavior During Therapy WFL for tasks assessed/performed             Past Medical History:  Diagnosis Date   Actinic keratosis    Arthritis    osteoarthritis   GERD (gastroesophageal reflux disease)    Headache    migraines   Hx of squamous cell carcinoma    multiple sites   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 SCCis   Squamous cell skin cancer 11/02/2013   Left medial infraclavicular. SCCis, early evolving.   Squamous cell skin cancer 07/29/2018   Left distal pretibial. WD SCC with superficial infiltration.   Past Surgical History:  Procedure Laterality Date   ABDOMINAL HYSTERECTOMY     APPENDECTOMY     AUGMENTATION MAMMAPLASTY Bilateral 1980's   CATARACT EXTRACTION, BILATERAL     CHOLECYSTECTOMY     COLONOSCOPY     EYE SURGERY Bilateral    Cataract Extraction with IOL   JOINT REPLACEMENT Right    knee   KNEE ARTHROPLASTY Left 04/09/2016   Procedure: COMPUTER ASSISTED TOTAL KNEE ARTHROPLASTY;  Surgeon: Lynwood SHAUNNA Hue, MD;  Location: [facility];  Service: Orthopedics;  Laterality: Left;   KNEE ARTHROSCOPY Left 04/02/2015   Procedure: ARTHROSCOPY KNEE. 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  REFERRING PROVIDER: Schermerhorn   REFERRING DIAG: cystocele-midlinee  Rationale for Evaluation and Treatment Rehabilitation  THERAPY DIAG:  Sacrococcygeal disorders, not elsewhere classified  Other low back pain  Other abnormalities of gait and mobility  ONSET DATE:   SUBJECTIVE:           SUBJECTIVE STATEMENT TODAY:  Pt noticed the L thigh numbness is much better across the past weeks. She does notices numbness sometimes when she touched her top L thigh but it goes away quickly.   Pt felt sore after last session at the area therapist worked on. The soreness after several days Straining with urination decreased from 50% of the time to 0% of the time Straining with bowel movements across 30% of the time instead of 100% of the time. L thigh numbness decreased from 8/10 pain intensity to 2/10 pain intensity LBP has resolved and she is able to perform shopping 2-3 hours, doing dishes   SUBJECTIVE STATEMENT ON EVAL 11/09/23 : 1) vaginal bulge: pt noticed it until she is in the shower when she is washing herself. Pt is pushing it up. Af first when it first happened. It bothered her. Pt has adjusted to it mentally and has not noticed bothering her.  Pt goes up and down stairs. Pt does not notice the prolapse worsening when lifting small items. Urination:  I feel like I got to go but I cannnot because bladder is in the way or she is trying to go a second Urination difficulty occurs  50% of the time . Pt strains 100% of the time with bowel movements. Stool consistency Type 1-2 . Takes probiotics/ Metamucil, eating fiber, drinks lots of water   2) low back pain located between low ribs- occurred for the last 6 months, after shopping 2-3 hours, doing dishes, 6-7/10 pain , uses cold pack for full relief   3) L thigh numbness started this past summer.  It occurs after she has been up for a while, walking,  cooking, . It appears suddenly. It eases with sitting. Numbness intensity 8/10 covers the top of her thigh above the knee to the side hip   4) Nocturia:  3-4 x night, has not had sleep study to rule out/ in OSA,    PERTINENT HISTORY:  2 vaginal deliveries , does not remember perineal scars   Abdominal hysterectomy Appendectomy Gall bladder removal   B TKA   Skin cancer   PAIN:  Are you having pain? Yes: see above   PRECAUTIONS: No  WEIGHT BEARING RESTRICTIONS: No  FALLS:  Has patient fallen in last 6 months? No   LIVING ENVIRONMENT: Lives with: husband  Lives in: one story  Stairs: 3 STE with rail   OCCUPATION: retired runner, broadcasting/film/video , hobbies: reading, bible study, aquatic classes M, W, F, beach trips once a month,   PLOF: IND  PATIENT GOALS:  Low back pain resolves,  prolapse issue improve, decrease urination at night , decrease L thigh numbness improved     OBJECTIVE:     [facility] PT Assessment - 02/10/24 1456       Observation/Other Assessments   Observations leg length difference 82 cm on R, 81 cm on L, from ASIS to medial malleoli L      Palpation   Palpation comment C3-5, T1-3 deviated, interspinals, rhomobhoids L tightness and tenderness          [facility] Adult PT Treatment/Exercise - 02/10/24 1457       Self-Care   Self-Care Other Self-Care Comments    Other Self-Care Comments  fitted shoe lift in L shoe toe box and heel , explained leg length difference impacting asymmetries to spine and pelvis , advised pt to stop using the pillow with hump and to use a typical flat pillow      Manual Therapy   Manual therapy comments distraction , medial glide , STM/MWM at problem areas noted in assessment                   HOME EXERCISE PROGRAM: See pt instruction section    ASSESSMENT:  CLINICAL IMPRESSION:  Pt has met 7/9 goals and partially met remaining goals.   Across the past  visits,  Improvements include:  Vaginal bulging  improvements:  Pt is no longer having to push up her bladder and it feels it is not as low as it was.  Straining with urination decreased from 50% of the time to 0% of the time Straining with bowel movements across 30% of the time instead of 100% of the time.  Pt does not experience a dribble after urinating the first time to urinating   Nocturia improvements: Nocturia: decreased to 2 x night instead of  3-4 x night  L thigh numbness improvements:  L thigh numbness decreased from 8/10 pain intensity to 2/10 pain intensity LBP has resolved and she is able to perform shopping 2-3 hours, doing dishes   Structural improvements: Pt has achieved more upright posture, levelled shoulders and pelvis. Pt is more aware of her body mechanics and posture    Today,   Continued address with manual Tx at upper spine to minimize deviated segments in cervical and thoracic segments. Post Manual Tx, pt demo'd improved lateral and anterior excursion of diaphragm which will help her progress to deep core training upcoming sessions.  Advised pt to stop using the pillow with hump and to use a typical flat pillow   Assessed for leg length difference which showed shorter L LE ( Hx of B TKA but two different surgeons) .Fitted shoe lift in L shoe toe box and heel , explained leg length difference impacting asymmetries to spine and pelvis. Pt showed levelled pelvis and reported no pain with shoe lift.   Plan to add cervicoscapular strengthening at next session  Discussed upcoming sessions to address prolapse   Skilled PT will further help with remaining goals to help improve complete emptying of urine and bowels without straining and improve L radiating thigh pain from low back.    Anticipate these improvements will  to help promote optimize IAP system for improved pelvic floor function, trunk stability, gait, balance, stabilization with mobility tasks. Regional interdependent approaches will yield greater  benefits in pt's POC due to the complexity of pt's medical Hx    Pt benefits from skilled PT to address these Sx:  vaginal bulge:    OBJECTIVE IMPAIRMENTS decreased activity tolerance, decreased coordination, decreased endurance, decreased mobility, difficulty walking, decreased ROM, decreased strength, decreased safety awareness, hypomobility, increased muscle spasms, impaired flexibility, improper body mechanics, postural dysfunction, and pain. scar restrictions   ACTIVITY LIMITATIONS  self-care,  sleep, home chores, work tasks    PARTICIPATION LIMITATIONS:  community, cooking  activities,     PERSONAL FACTORS   affecting patient's functional outcome:    REHAB POTENTIAL: Good   CLINICAL DECISION MAKING: Evolving/moderate complexity   EVALUATION COMPLEXITY: Moderate    PATIENT EDUCATION:    Education details: Showed pt anatomy images. Explained muscles attachments/ connection, physiology of deep core system/ spinal- thoracic-pelvis-lower kinetic chain as they relate to pt's presentation, Sx, and past Hx. Explained what and how these areas of deficits need to be restored to balance and function    See Therapeutic activity / neuromuscular re-education section  Answered pt's questions.   Person educated: Patient Education method: Explanation, Demonstration, Tactile cues, Verbal cues, and Handouts Education comprehension: verbalized understanding, returned demonstration, verbal cues required, tactile cues required, and needs further education     PLAN: PT FREQUENCY: 1x/week   PT DURATION: 10 weeks   PLANNED INTERVENTIONS:   Gait training;Stair training;Functional mobility training;DME Instruction;Therapeutic activities;Therapeutic exercise;Balance training;Neuromuscular re-education;Patient/family education;Vestibular;Visual/perceptual remediation/compensation;Passive range of motion;Moist Heat;Cryotherapy;Traction;Canalith Repostioning;Joint Manipulations;Manual lymph  drainage;Manual techniques;Scar mobilization;Energy conservation;Dry needling;ADLs/Self Care Home Management;Biofeedback;Electrical Stimulation;Taping    PLAN FOR NEXT SESSION: See clinical impression for plan     GOALS: Goals reviewed with patient? Yes  SHORT TERM GOALS: Target date: 12/07/2023    Pt will demo IND with HEP                    Baseline: Not IND            Goal status: INITIAL   LONG TERM GOALS: Target date: 04/05/2024  1.Pt will demo proper deep core coordination without chest breathing and optimal excursion of diaphragm/pelvic floor in order to promote spinal stability and pelvic floor function  Baseline: dyscoordination Goal status:  MET   2.  Pt will demo proper body mechanics in against gravity tasks and ADLs  work tasks, fitness  to minimize straining pelvic floor / back    Baseline: not IND, improper form that places strain on pelvic floor  Goal status: MET     3. Pt will demo increased gait speed > 1.3 m/s with reciprocal gait pattern, longer stride length  in order to ambulate safely in community and return to fitness routine  Baseline: 1.24 m/s, pelvic shift to L, limited L thoracic posterior rotation  Goal status:  MET   ( 12/21/23:  1.45 m/s , reciprocal )     4. Pt will demo levelled pelvic girdle and shoulder height in order to progress to deep core strengthening HEP and restore mobility at spine, pelvis, gait, posture minimize falls, and improve balance  Baseline: L shoulder, R iliac crest higher, thoracic shift to R  Goal status: MET    5. Pt will improve PFDI-7 questionnaire to  pts  score change  to demo improved QOL  Baseline:    ( greater pts indicate greater negative impact on QOL)   62  pts  ( total)  _ > 29   38   pts  ( UIQ-7 )   --> 24  19  pts  ( CRAIQ-7 )  --> 5   5   pts  ( POPIQ-7 )  -->  0   Baseline:  Goal status: MET    6.  Pt will report able to urinate without needing to go a second time and or able to initiate  urination without prolapse in the way across > 75% of the time  Baseline:  Urination:  I feel like I got to go but I cannnot because bladder is in the way or she is trying to go a second Urination difficulty occurs  50% of the time Goal status:  MET      7. Pt will report no more straining with bowel movements  and report  Stool type 4   from _0% of the time to > 50% of the time, decrease type 1-2 from 100% of time to < 50% of the time  Baseline: straining 100% of the time  Goal status:  Partially MET ( 12/21/23:  straining 40% of time instead 100%  of the time)    8. Pt will communicate with MD about sleep study  Baseline: pt has not tested , nocturia 3-4 x night  Goal Status: MET,  ( 12/21/23: pt read the articles therapist provided, pt voiced she will f/u )   9. Pt will report decreased numbness along L thigh from 8/10 to < 4/10 intensity while walking, cooking, Baseline: numbness along L thigh 8/10  walking, cooking, Goal Status: Partially MET  ( 12/21/23: not as often but at intensity 5/10 instead of 8/10 occurring after standing. Cooking 45 min )    Pia Lupe Plump, PT 02/10/2024, 2:23 PM  "

## 2024-02-10 NOTE — Patient Instructions (Addendum)
 Wear shoe lift in toe box and heel in ___   shoe only  all week, remove if uncomfortable,   If it helps, I will show you where to buy to replace every 6 months and to put in other shoes on   ___   shoe only     [employer] Adjustable Heel Lift at [email or web address]     Two pieces for each __   shoe    One Large, for toe box  One medium for the heel     ** It comes with thick piece and   be sure to peel top piece off and use that piece in the shoe  ** REPLACE every 6 months as they get worn down   __   Advised to discontinue using her pillow with hump and to sleep on a normal flatter pillow

## 2024-02-18 ENCOUNTER — Ambulatory Visit: Admitting: Physical Therapy

## 2024-03-10 ENCOUNTER — Ambulatory Visit: Admitting: Physical Therapy

## 2024-03-16 ENCOUNTER — Ambulatory Visit: Admitting: Physical Therapy

## 2024-04-04 ENCOUNTER — Ambulatory Visit: Admitting: Dermatology

## 2024-12-19 ENCOUNTER — Ambulatory Visit: Admitting: Dermatology

## 2025-01-24 ENCOUNTER — Ambulatory Visit: Admitting: Plastic Surgery
